# Patient Record
Sex: Female | Born: 1965
Health system: Southern US, Community
[De-identification: ages and names within clinical notes are randomized; demographics above are authoritative.]

## PROBLEM LIST (undated history)

## (undated) DIAGNOSIS — E785 Hyperlipidemia, unspecified: Secondary | ICD-10-CM

## (undated) DIAGNOSIS — F32A Depression, unspecified: Secondary | ICD-10-CM

## (undated) DIAGNOSIS — J45909 Unspecified asthma, uncomplicated: Secondary | ICD-10-CM

## (undated) DIAGNOSIS — E119 Type 2 diabetes mellitus without complications: Secondary | ICD-10-CM

## (undated) DIAGNOSIS — F329 Major depressive disorder, single episode, unspecified: Secondary | ICD-10-CM

## (undated) DIAGNOSIS — I10 Essential (primary) hypertension: Secondary | ICD-10-CM

## (undated) DIAGNOSIS — T7840XA Allergy, unspecified, initial encounter: Secondary | ICD-10-CM

## (undated) HISTORY — DX: Allergy, unspecified, initial encounter: T78.40XA

## (undated) HISTORY — DX: Type 2 diabetes mellitus without complications: E11.9

## (undated) HISTORY — DX: Essential (primary) hypertension: I10

## (undated) HISTORY — DX: Major depressive disorder, single episode, unspecified: F32.9

## (undated) HISTORY — DX: Unspecified asthma, uncomplicated: J45.909

## (undated) HISTORY — DX: Hyperlipidemia, unspecified: E78.5

## (undated) HISTORY — DX: Depression, unspecified: F32.A

---

## 2001-10-08 ENCOUNTER — Emergency Department (HOSPITAL_COMMUNITY): Admission: EM | Admit: 2001-10-08 | Discharge: 2001-10-08 | Payer: Self-pay | Admitting: *Deleted

## 2001-10-08 ENCOUNTER — Encounter: Payer: Self-pay | Admitting: *Deleted

## 2001-11-01 ENCOUNTER — Emergency Department (HOSPITAL_COMMUNITY): Admission: EM | Admit: 2001-11-01 | Discharge: 2001-11-01 | Payer: Self-pay | Admitting: *Deleted

## 2001-11-01 ENCOUNTER — Encounter: Payer: Self-pay | Admitting: *Deleted

## 2001-12-08 ENCOUNTER — Emergency Department (HOSPITAL_COMMUNITY): Admission: EM | Admit: 2001-12-08 | Discharge: 2001-12-08 | Payer: Self-pay | Admitting: *Deleted

## 2008-12-07 ENCOUNTER — Encounter: Admission: RE | Admit: 2008-12-07 | Discharge: 2008-12-07 | Payer: Self-pay | Admitting: Internal Medicine

## 2010-02-21 ENCOUNTER — Encounter: Payer: Self-pay | Admitting: Internal Medicine

## 2010-03-19 ENCOUNTER — Encounter: Payer: Self-pay | Admitting: Internal Medicine

## 2010-04-22 ENCOUNTER — Emergency Department (HOSPITAL_COMMUNITY)
Admission: EM | Admit: 2010-04-22 | Discharge: 2010-04-22 | Disposition: A | Discharge status: Home / Self Care | Payer: Medicare Other | Attending: Emergency Medicine | Admitting: Emergency Medicine

## 2010-04-22 DIAGNOSIS — M545 Low back pain, unspecified: Secondary | ICD-10-CM | POA: Insufficient documentation

## 2010-04-22 DIAGNOSIS — J45909 Unspecified asthma, uncomplicated: Secondary | ICD-10-CM | POA: Insufficient documentation

## 2010-04-22 DIAGNOSIS — E119 Type 2 diabetes mellitus without complications: Secondary | ICD-10-CM | POA: Insufficient documentation

## 2010-04-22 DIAGNOSIS — Z7982 Long term (current) use of aspirin: Secondary | ICD-10-CM | POA: Insufficient documentation

## 2010-04-22 DIAGNOSIS — Z794 Long term (current) use of insulin: Secondary | ICD-10-CM | POA: Insufficient documentation

## 2010-04-22 DIAGNOSIS — Z79899 Other long term (current) drug therapy: Secondary | ICD-10-CM | POA: Insufficient documentation

## 2010-04-22 DIAGNOSIS — I10 Essential (primary) hypertension: Secondary | ICD-10-CM | POA: Insufficient documentation

## 2013-01-22 ENCOUNTER — Other Ambulatory Visit: Payer: Self-pay

## 2013-01-22 DIAGNOSIS — Z1231 Encounter for screening mammogram for malignant neoplasm of breast: Secondary | ICD-10-CM

## 2013-12-10 ENCOUNTER — Ambulatory Visit: Payer: Self-pay

## 2014-10-26 ENCOUNTER — Encounter: Payer: Self-pay | Admitting: Gastroenterology

## 2014-11-03 ENCOUNTER — Other Ambulatory Visit: Payer: Self-pay | Admitting: Family

## 2014-11-03 DIAGNOSIS — Z1231 Encounter for screening mammogram for malignant neoplasm of breast: Secondary | ICD-10-CM

## 2014-11-16 ENCOUNTER — Ambulatory Visit: Payer: Self-pay

## 2014-12-13 ENCOUNTER — Encounter: Payer: Self-pay | Admitting: Family

## 2014-12-26 ENCOUNTER — Encounter: Payer: Self-pay | Admitting: Gastroenterology

## 2015-02-01 DIAGNOSIS — H52223 Regular astigmatism, bilateral: Secondary | ICD-10-CM | POA: Diagnosis not present

## 2015-02-01 DIAGNOSIS — H524 Presbyopia: Secondary | ICD-10-CM | POA: Diagnosis not present

## 2015-02-01 DIAGNOSIS — H5212 Myopia, left eye: Secondary | ICD-10-CM | POA: Diagnosis not present

## 2015-02-01 DIAGNOSIS — H40013 Open angle with borderline findings, low risk, bilateral: Secondary | ICD-10-CM | POA: Diagnosis not present

## 2015-02-02 ENCOUNTER — Encounter: Payer: Medicare Other | Attending: Internal Medicine

## 2015-02-02 VITALS — Ht 59.0 in | Wt 170.4 lb

## 2015-02-02 DIAGNOSIS — E119 Type 2 diabetes mellitus without complications: Secondary | ICD-10-CM | POA: Insufficient documentation

## 2015-02-02 DIAGNOSIS — Z713 Dietary counseling and surveillance: Secondary | ICD-10-CM | POA: Diagnosis not present

## 2015-02-02 NOTE — Progress Notes (Signed)
Patient was seen on 02/02/15 for the first of a series of three diabetes self-management courses at [facility].  Patient Education Plan per assessed needs and concerns is to attend four course education program for Diabetes Self Management Education.  The following learning objectives were met by the patient during this class:  Describe diabetes  State some common risk factors for diabetes  Defines the role of glucose and insulin  Identifies type of diabetes and pathophysiology  Describe the relationship between diabetes and cardiovascular risk  State the members of the Healthcare Team  States the rationale for glucose monitoring  State when to test glucose  State their individual Target Range  State the importance of logging glucose readings  Describe how to interpret glucose readings  Identifies A1C target  Explain the correlation between A1c and eAG values  State symptoms and treatment of high blood glucose  State symptoms and treatment of low blood glucose  Explain proper technique for glucose testing  Identifies proper sharps disposal  Handouts given during class include:  Living Well with Diabetes book  Carb Counting and Meal Planning book  Meal Plan Card  Carbohydrate guide  Meal planning worksheet  Low Sodium Flavoring Tips  The diabetes portion plate  S1N to eAG Conversion Chart  Diabetes Medications  Diabetes Recommended Care Schedule  Support Group  Diabetes Success Plan  Core Class Satisfaction Survey  Follow-Up Plan:  Attend core 2

## 2015-02-06 ENCOUNTER — Other Ambulatory Visit: Payer: Self-pay

## 2015-02-06 VITALS — BP 94/60 | Ht 59.0 in | Wt 171.1 lb

## 2015-02-06 DIAGNOSIS — Z794 Long term (current) use of insulin: Principal | ICD-10-CM

## 2015-02-06 DIAGNOSIS — E119 Type 2 diabetes mellitus without complications: Secondary | ICD-10-CM

## 2015-02-06 NOTE — Patient Outreach (Signed)
[facility]) Care Management  [facility]  02/06/2015   Delia HeadyJanice A Grandt 1965-06-23 [id number]  Subjective: Patient in for her [facility] [place] diabetes management program introductory visit.  She has started her diabetes classes and will complete 2 more over the next two weeks. She reports checking blood sugars 3 times per day; when she wakes up, 2 hours after lunch and at 9pm before she takes Lantus insulin.  She sets an alarm for 9pm to check her sugar.  She admits to skipping insulin often because her sugars are low (80-90mg /dl) and she is afraid for her blood sugar to go too low through the middle of the night.  When she has this type of blood sugar number, she eats sweets to prevent it from going any lower.  She eats three meals a day and likes sweet. Last A1C was 9%, up from 7.4% in September, 2016.    Objective:  Filed Vitals:   02/06/15 1506  BP: 94/60  Height: 1.499 m (4\' 11" )  Weight: 171 lb 1.6 oz (77.61 kg)     Current Medications:  Current Outpatient Prescriptions  Medication Sig Dispense Refill  . albuterol (PROVENTIL HFA;VENTOLIN HFA) 108 (90 Base) MCG/ACT inhaler Inhale 1 puff into the lungs every 6 (six) hours as needed.    Marland Kitchen. albuterol (PROVENTIL) (5 MG/ML) 0.5% nebulizer solution Take 2.5 mg by nebulization every 6 (six) hours as needed.    . fluticasone (FLONASE) 50 MCG/ACT nasal spray Place 1 spray into both nostrils daily.    . Fluticasone-Salmeterol (ADVAIR) 500-50 MCG/DOSE AEPB Inhale 1 puff into the lungs 2 (two) times daily.    Marland Kitchen. glucagon 1 MG injection Inject 1 mg into the muscle once as needed. Has never filled the prescription    . Insulin Glargine (LANTUS SOLOSTAR [place]) Inject 40 Units into the skin at bedtime.    Marland Kitchen. lisinopril-hydrochlorothiazide (PRINZIDE,ZESTORETIC) 20-12.5 MG tablet Take 1 tablet by mouth daily.    Marland Kitchen. loratadine (CLARITIN) 10 MG tablet Take 10 mg by mouth daily.    . montelukast (SINGULAIR) 10 MG tablet  Take 10 mg by mouth at bedtime.    . Multiple Vitamin (MULTIVITAMIN) capsule Take 1 capsule by mouth daily.    . simvastatin (ZOCOR) 20 MG tablet Take 20 mg by mouth daily.     No current facility-administered medications for this visit.    Functional Status:  No flowsheet data found.  Fall/Depression Screening: PHQ 2/9 Scores 02/06/2015 02/02/2015  PHQ - 2 Score 0 0    Assessment: Liborio NixonJanice is able to demonstrate the correct technique for taking her Lantus insulin.  She shows me her diabetes education material and appears to have a beginning understanding of meal planning. She wakes at 4am, eats at 9am, 1pm and 6pm with no snacks in between. She is skipping Lantus some nights for fear of going too low.   Plan:  I have asked Liborio NixonJanice to take her insulin EVERY night and to begin with 30 units so we build her confidence in NOT having lows.  We will continue to push her insulin up to 40 units of Lantus as ordered.  I have asked her to eat a snack when she wakes at 4 in the morning, eat a snack at 3pm because she's going 6 hours without food and complains of being hungry on her way home from work.  [facility] CM Care Plan Problem One        Most Recent Value  Care Plan Problem One  Elevated A1C   Role Documenting the Problem One  Care Management Coordinator   Care Plan for Problem One  Active   [facility] Long Term Goal (31-90 days)  Decrease the A1C to less than 9% by the next A1C check in 3 months   [facility] Long Term Goal Start Date  02/06/15   Interventions for Problem One Long Term Goal  1. Take Lantus every night - 30 units  2. Eat a snack every night- 1 carbohydrate and 1 protein at bed  3. limit sweets to your meal    I have given her a BP cuff and asked her to begin checking her BP and to bring it to her next MD visit.  I have reviewed the benefits of LiveLifeWell, taking her medication consistently, exercise and managing her diabetes.  I will follow up with her in a couple of weeks to make sure she's not  having hypo or hyper glycemia.  I reviewed the treatment of hypoglycemia and have given her glucose tablets.     [name], RN, BA, MHA, CDE [facility] Diabetes Coordinator- Link To [employer] Dial:  [phone]  Fax:  [phone] E-mail: [name] [address] [place], [place]  [address]

## 2015-02-09 DIAGNOSIS — E119 Type 2 diabetes mellitus without complications: Secondary | ICD-10-CM

## 2015-02-14 ENCOUNTER — Other Ambulatory Visit: Payer: Self-pay

## 2015-02-14 NOTE — Patient Outreach (Signed)
[facility]) Care Management  02/14/2015  Destiny Trujillo [birth year] [id number]  Spoke to [name] today by phone as part of the [facility] diabetes program of [place].  [name] reports being consistent with her Lantus 30 units qhs (she is ordered 40 units but was having a lot of low blood sugars and therefore was being inconsistent with taking the insulin)- she reports fasting blood sugars of 79-80mg /dl each am.  She reports now eating at 4am (she wasn't) when she wakes up (peanut butter crackers), breakfast at 9am, lunch at noon and supper at 6pm.   Her afternoon blood sugars are 123-140mg /dl.   She has a final diabetes class on 02/16/15.  I will follow up with her at the end of February, 2017.   [name], RN, BA, MHA, CDE [facility] Diabetes Coordinator- Link To [employer] Dial:  [phone]  Fax:  [phone] E-mail: [name] [address] [place], [place]  [address]

## 2015-02-16 DIAGNOSIS — E119 Type 2 diabetes mellitus without complications: Secondary | ICD-10-CM | POA: Diagnosis not present

## 2015-02-16 NOTE — Progress Notes (Signed)
Patient was seen on 02/16/15 for the third of a series of three diabetes self-management courses at [facility].   Janene Madeira the amount of activity recommended for healthy living . Describe activities suitable for individual needs . Identify ways to regularly incorporate activity into daily life . Identify barriers to activity and ways to over come these barriers  Identify diabetes medications being personally used and their primary action for lowering glucose and possible side effects . Describe role of stress on blood glucose and develop strategies to address psychosocial issues . Identify diabetes complications and ways to prevent them  Explain how to manage diabetes during illness . Evaluate success in meeting personal goal . Establish 2-3 goals that they will plan to diligently work on until they return for the  36-month follow-up visit  Goals:   I will take my diabetes medications as scheduled  I will eat less unhealthy fats by eating less sweets and junk foods  To help manage stress I will work out at least 5 times a week  Your patient has identified these potential barriers to change:  Finances  Your patient has identified their diabetes self-care support plan as  Family Education officer, environmental Resources Plan:  Attend Optional Core 4 in 4 months

## 2015-02-16 NOTE — Progress Notes (Signed)

## 2015-03-12 ENCOUNTER — Emergency Department (HOSPITAL_COMMUNITY)
Admission: EM | Admit: 2015-03-12 | Discharge: 2015-03-12 | Disposition: A | Discharge status: Home / Self Care | Payer: Medicare Other | Attending: Emergency Medicine | Admitting: Emergency Medicine

## 2015-03-12 ENCOUNTER — Encounter (HOSPITAL_COMMUNITY): Payer: Self-pay | Admitting: Emergency Medicine

## 2015-03-12 DIAGNOSIS — J45909 Unspecified asthma, uncomplicated: Secondary | ICD-10-CM | POA: Insufficient documentation

## 2015-03-12 DIAGNOSIS — Z880 Allergy status to penicillin: Secondary | ICD-10-CM | POA: Diagnosis not present

## 2015-03-12 DIAGNOSIS — Z79899 Other long term (current) drug therapy: Secondary | ICD-10-CM | POA: Diagnosis not present

## 2015-03-12 DIAGNOSIS — I10 Essential (primary) hypertension: Secondary | ICD-10-CM | POA: Diagnosis not present

## 2015-03-12 DIAGNOSIS — Z7984 Long term (current) use of oral hypoglycemic drugs: Secondary | ICD-10-CM | POA: Diagnosis not present

## 2015-03-12 DIAGNOSIS — J0100 Acute maxillary sinusitis, unspecified: Secondary | ICD-10-CM | POA: Diagnosis not present

## 2015-03-12 DIAGNOSIS — E119 Type 2 diabetes mellitus without complications: Secondary | ICD-10-CM | POA: Diagnosis not present

## 2015-03-12 DIAGNOSIS — R05 Cough: Secondary | ICD-10-CM | POA: Diagnosis present

## 2015-03-12 MED ORDER — AZITHROMYCIN 250 MG PO TABS: 250.0000 mg | ORAL_TABLET | Freq: Every day | ORAL | Status: DC

## 2015-03-12 NOTE — ED Notes (Signed)
Pt c/o shob, cough, congestion onset yesterday

## 2015-03-12 NOTE — Discharge Instructions (Signed)

## 2015-03-12 NOTE — ED Provider Notes (Signed)
CSN: [id number]     Arrival date & time 03/12/15  0247 History   First MD Initiated Contact with Patient 03/12/15 [phone]     Chief Complaint  Patient presents with  . Cough     (Consider location/radiation/quality/duration/timing/severity/associated sxs/prior Treatment) HPI Comments: Patient presents with cough, body aches, fever (Tmax 101 at home), sinus pressure and nasal congestion for the past 2 days. She reports multiple family members ill with similar symptoms. No nausea or vomiting. No headaches or sore throat. She has a history of astham and reports use of nebulizers with increased frequency during the last 2 days. No SOB currently.   Patient is a 50 y.o. female presenting with cough. The history is provided by the patient. No language interpreter was used.  Cough Associated symptoms: fever and myalgias   Associated symptoms: no chills, no headaches, no rash and no sore throat     Past Medical History  Diagnosis Date  . Asthma   . Hypertension   . Diabetes mellitus without complication (HCC)    History reviewed. No pertinent past surgical history. No family history on file. Social History  Substance Use Topics  . Smoking status: Never Smoker   . Smokeless tobacco: None  . Alcohol Use: No   OB History    No data available     Review of Systems  Constitutional: Positive for fever. Negative for chills.  HENT: Positive for congestion and sinus pressure. Negative for sore throat and trouble swallowing.   Respiratory: Positive for cough.   Cardiovascular: Negative.   Gastrointestinal: Negative.  Negative for nausea, vomiting and diarrhea.  Musculoskeletal: Positive for myalgias. Negative for neck stiffness.  Skin: Negative.  Negative for rash.  Neurological: Negative.  Negative for headaches.      Allergies  Penicillins  Home Medications   Prior to Admission medications   Medication Sig Start Date End Date Taking? Authorizing Provider   lisinopril-hydrochlorothiazide (PRINZIDE,ZESTORETIC) 20-12.5 MG tablet Take by mouth. 02/20/15  Yes Historical Provider, MD  ADVAIR DISKUS 500-50 MCG/DOSE AEPB Inhale 1 puff into the lungs 2 (two) times daily. 02/18/15   Historical Provider, MD  LANTUS SOLOSTAR 100 UNIT/ML Solostar Pen TAKE 40 UNITS DAILY. 02/18/15   Historical Provider, MD  metFORMIN (GLUCOPHAGE) 1000 MG tablet TAKE 1 TABLET (1,000 MG TOTAL) BY MOUTH 2 (TWO) TIMES A DAY WITH MEALS. 01/17/15   Historical Provider, MD  montelukast (SINGULAIR) 10 MG tablet Take 10 mg by mouth daily. 02/18/15   Historical Provider, MD  simvastatin (ZOCOR) 20 MG tablet Take 20 mg by mouth daily. 01/16/15   Historical Provider, MD   BP 117/84 mmHg  Pulse 94  Temp(Src) 99.1 F (37.3 C) (Oral)  Resp 18  Ht  (1.499 m)  Wt 73.483 kg  BMI 32.70 kg/m2  SpO2 97% Physical Exam  Constitutional: She is oriented to person, place, and time. She appears well-developed and well-nourished.  HENT:  Head: Normocephalic.  Nose: Mucosal edema present. Right sinus exhibits maxillary sinus tenderness. Left sinus exhibits maxillary sinus tenderness.  Mouth/Throat: Uvula is midline and oropharynx is clear and moist. Mucous membranes are not dry.  Neck: Normal range of motion. Neck supple.  Cardiovascular: Normal rate and regular rhythm.   Pulmonary/Chest: Effort normal and breath sounds normal.  Abdominal: Soft. Bowel sounds are normal. There is no tenderness. There is no rebound and no guarding.  Musculoskeletal: Normal range of motion.  Neurological: She is alert and oriented to person, place, and time.  Skin: Skin is warm  and dry. No rash noted.  Psychiatric: She has a normal mood and affect.    ED Course  Procedures (including critical care time) Labs Review Labs Reviewed - No data to display  Imaging Review No results found. I have personally reviewed and evaluated these images and lab results as part of my medical decision-making.   EKG  Interpretation None      MDM   Final diagnoses:  None    1. Sinusitis  The patient has symptoms of URI, body aches, cough. Here VSS. Flu-like illness vs sinusitis given sinus tenderness and nasal mucosal swelling. Will treat with abx and recommend PCP follow up this week for recheck.     Elpidio Anis, PA-C 03/12/15 0422  Cy Blamer, MD 03/12/15 [phone]

## 2015-03-16 ENCOUNTER — Encounter: Payer: Self-pay | Admitting: Internal Medicine

## 2015-03-16 ENCOUNTER — Ambulatory Visit (INDEPENDENT_AMBULATORY_CARE_PROVIDER_SITE_OTHER): Payer: 59 | Admitting: Internal Medicine

## 2015-03-16 ENCOUNTER — Other Ambulatory Visit (INDEPENDENT_AMBULATORY_CARE_PROVIDER_SITE_OTHER): Payer: 59

## 2015-03-16 VITALS — BP 108/78 | HR 97 | Temp 98.5°F | Resp 14 | Ht 59.0 in | Wt 170.8 lb

## 2015-03-16 DIAGNOSIS — E669 Obesity, unspecified: Secondary | ICD-10-CM

## 2015-03-16 DIAGNOSIS — IMO0001 Reserved for inherently not codable concepts without codable children: Secondary | ICD-10-CM

## 2015-03-16 DIAGNOSIS — E1165 Type 2 diabetes mellitus with hyperglycemia: Secondary | ICD-10-CM

## 2015-03-16 DIAGNOSIS — J4530 Mild persistent asthma, uncomplicated: Secondary | ICD-10-CM

## 2015-03-16 DIAGNOSIS — E785 Hyperlipidemia, unspecified: Secondary | ICD-10-CM | POA: Diagnosis not present

## 2015-03-16 DIAGNOSIS — Z794 Long term (current) use of insulin: Secondary | ICD-10-CM

## 2015-03-16 DIAGNOSIS — E119 Type 2 diabetes mellitus without complications: Secondary | ICD-10-CM

## 2015-03-16 DIAGNOSIS — I10 Essential (primary) hypertension: Secondary | ICD-10-CM | POA: Diagnosis not present

## 2015-03-16 LAB — COMPREHENSIVE METABOLIC PANEL
ALBUMIN: 4.6 g/dL (ref 3.5–5.2)
ALT: 19 U/L (ref 0–35)
AST: 16 U/L (ref 0–37)
Alkaline Phosphatase: 65 U/L (ref 39–117)
BILIRUBIN TOTAL: 0.2 mg/dL (ref 0.2–1.2)
BUN: 9 mg/dL (ref 6–23)
CALCIUM: 9.7 mg/dL (ref 8.4–10.5)
CHLORIDE: 99 meq/L (ref 96–112)
CO2: 32 mEq/L (ref 19–32)
CREATININE: 0.68 mg/dL (ref 0.40–1.20)
GFR: 117.51 mL/min (ref >60.00)
Glucose, Bld: 151 mg/dL — ABNORMAL HIGH (ref 70–99)
Potassium: 3.3 mEq/L — ABNORMAL LOW (ref 3.5–5.1)
Sodium: 138 mEq/L (ref 135–145)
Total Protein: 8.2 g/dL (ref 6.0–8.3)

## 2015-03-16 LAB — CBC
HCT: 39.3 % (ref 36.0–46.0)
Hemoglobin: 13.1 g/dL (ref 12.0–15.0)
MCHC: 33.2 g/dL (ref 30.0–36.0)
MCV: 81.8 fl (ref 78.0–100.0)
PLATELETS: 326 10*3/uL (ref 150.0–400.0)
RBC: 4.81 Mil/uL (ref 3.87–5.11)
RDW: 14 % (ref 11.5–15.5)
WBC: 6.8 10*3/uL (ref 4.0–10.5)

## 2015-03-16 LAB — MICROALBUMIN / CREATININE URINE RATIO
Creatinine,U: 62.3 mg/dL
Microalb Creat Ratio: 1.1 mg/g (ref 0.0–30.0)

## 2015-03-16 LAB — LIPID PANEL
CHOLESTEROL: 155 mg/dL (ref 0–200)
HDL: 49.3 mg/dL (ref >39.00)
LDL CALC: 72 mg/dL (ref 0–99)
NonHDL: 105.73
TRIGLYCERIDES: 170 mg/dL — AB (ref 0.0–149.0)
Total CHOL/HDL Ratio: 3
VLDL: 34 mg/dL (ref 0.0–40.0)

## 2015-03-16 LAB — HEMOGLOBIN A1C: Hgb A1c MFr Bld: 7.2 % — ABNORMAL HIGH (ref 4.6–6.5)

## 2015-03-16 NOTE — Patient Instructions (Addendum)
We will check the labs today and will likely make some changes.   We will plan to stop the lantus and start a once weekly injection called bydureon that will help with the sugars and with weight loss.   We will plan to stop your lisinopril/hctz and start lisinopril only. We will send this in after the blood work is back.   We have filled out the forms for the housing and you need to complete the rest of the form which has been returned to you today.   Diabetes and Exercise Exercising regularly is important. It is not just about losing weight. It has many health benefits, such as:  Improving your overall fitness, flexibility, and endurance.  Increasing your bone density.  Helping with weight control.  Decreasing your body fat.  Increasing your muscle strength.  Reducing stress and tension.  Improving your overall health. People with diabetes who exercise gain additional benefits because exercise:  Reduces appetite.  Improves the body's use of blood sugar (glucose).  Helps lower or control blood glucose.  Decreases blood pressure.  Helps control blood lipids (such as cholesterol and triglycerides).  Improves the body's use of the hormone insulin by:  Increasing the body's insulin sensitivity.  Reducing the body's insulin needs.  Decreases the risk for heart disease because exercising:  Lowers cholesterol and triglycerides levels.  Increases the levels of good cholesterol (such as high-density lipoproteins [HDL]) in the body.  Lowers blood glucose levels. YOUR ACTIVITY PLAN  Choose an activity that you enjoy, and set realistic goals. To exercise safely, you should begin practicing any new physical activity slowly, and gradually increase the intensity of the exercise over time. Your health care provider or diabetes educator can help create an activity plan that works for you. General recommendations include:  Encouraging children to engage in at least 60 minutes of  physical activity each day.  Stretching and performing strength training exercises, such as yoga or weight lifting, at least 2 times per week.  Performing a total of at least 150 minutes of moderate-intensity exercise each week, such as brisk walking or water aerobics.  Exercising at least 3 days per week, making sure you allow no more than 2 consecutive days to pass without exercising.  Avoiding long periods of inactivity (90 minutes or more). When you have to spend an extended period of time sitting down, take frequent breaks to walk or stretch. RECOMMENDATIONS FOR EXERCISING WITH TYPE 1 OR TYPE 2 DIABETES   Check your blood glucose before exercising. If blood glucose levels are greater than 240 mg/dL, check for urine ketones. Do not exercise if ketones are present.  Avoid injecting insulin into areas of the body that are going to be exercised. For example, avoid injecting insulin into:  The arms when playing tennis.  The legs when jogging.  Keep a record of:  Food intake before and after you exercise.  Expected peak times of insulin action.  Blood glucose levels before and after you exercise.  The type and amount of exercise you have done.  Review your records with your health care provider. Your health care provider will help you to develop guidelines for adjusting food intake and insulin amounts before and after exercising.  If you take insulin or oral hypoglycemic agents, watch for signs and symptoms of hypoglycemia. They include:  Dizziness.  Shaking.  Sweating.  Chills.  Confusion.  Drink plenty of water while you exercise to prevent dehydration or heat stroke. Body water is lost  during exercise and must be replaced.  Talk to your health care provider before starting an exercise program to make sure it is safe for you. Remember, almost any type of activity is better than none.   This information is not intended to replace advice given to you by your health care  provider. Make sure you discuss any questions you have with your health care provider.   Document Released: 03/30/2003 Document Revised: 05/24/2014 Document Reviewed: 06/16/2012 Elsevier Interactive Patient Education [employer]

## 2015-03-16 NOTE — Progress Notes (Signed)
Pre visit review using our clinic review tool, if applicable. No additional management support is needed unless otherwise documented below in the visit note. 

## 2015-03-18 DIAGNOSIS — E118 Type 2 diabetes mellitus with unspecified complications: Secondary | ICD-10-CM | POA: Insufficient documentation

## 2015-03-18 DIAGNOSIS — J45909 Unspecified asthma, uncomplicated: Secondary | ICD-10-CM | POA: Insufficient documentation

## 2015-03-18 DIAGNOSIS — E1169 Type 2 diabetes mellitus with other specified complication: Secondary | ICD-10-CM | POA: Insufficient documentation

## 2015-03-18 DIAGNOSIS — I10 Essential (primary) hypertension: Secondary | ICD-10-CM | POA: Insufficient documentation

## 2015-03-18 DIAGNOSIS — E785 Hyperlipidemia, unspecified: Secondary | ICD-10-CM

## 2015-03-18 NOTE — Assessment & Plan Note (Signed)
BP at goal on lisinopril/hctz. Checking CMP and adjust as indicated.

## 2015-03-18 NOTE — Assessment & Plan Note (Signed)
Checking lipid panel, no side effects from zocor. Adjust as indicated.

## 2015-03-18 NOTE — Progress Notes (Signed)
   Subjective:    Patient ID: Destiny Trujillo, female    DOB: Jun 11, 1965, 50 y.o.   MRN: [id number]  HPI The patient is a new 50 YO female coming in for continuation of her medical problems including her diabetes (diagnosed in [place] and HgA1c over 10 so started on the lantus initially then added metformin, she is not sure of control, taking lantus, metformin with some low sugars in the morning, unsure if complicated, eye exam recent and normal), her blood pressure (controlled on lisinopril/hctz, no side effects), and her asthma (not well controlled and winded by activity, lives on upper floor apartment which is difficult for her, uses advair and if she misses that she can really tell the difference, no flare recently and never intubated). No acute concerns today.  PMH, [facility], social history reviewed and updated.   Review of Systems  Constitutional: Negative for fever, activity change, appetite change, fatigue and unexpected weight change.  HENT: Negative.   Eyes: Negative.   Respiratory: Negative.   Cardiovascular: Negative for chest pain, palpitations and leg swelling.  Gastrointestinal: Negative for nausea, abdominal pain, diarrhea, constipation, blood in stool and abdominal distention.  Musculoskeletal: Negative.   Skin: Negative.   Neurological: Negative.  Negative for weakness and numbness.  Psychiatric/Behavioral: Negative.       Objective:   Physical Exam  Constitutional: She is oriented to person, place, and time. She appears well-developed and well-nourished.  Overweight  HENT:  Head: Normocephalic and atraumatic.  Eyes: EOM are normal.  Neck: Normal range of motion.  Cardiovascular: Normal rate and regular rhythm.   Pulmonary/Chest: Effort normal and breath sounds normal. No respiratory distress. She has no wheezes. She has no rales.  Abdominal: Soft. Bowel sounds are normal. She exhibits no distension. There is no tenderness. There is no rebound.  Musculoskeletal: She exhibits  no edema.  Neurological: She is alert and oriented to person, place, and time. Coordination normal.  Skin: Skin is warm and dry.  Psychiatric: She has a normal mood and affect.   Filed Vitals:   03/16/15 1532  BP: 108/78  Pulse: 97  Temp: 98.5 F (36.9 C)  TempSrc: Oral  Resp: 14  Height:  (1.499 m)  Weight: 170 lb 12.8 oz (77.474 kg)  SpO2: 99%      Assessment & Plan:

## 2015-03-18 NOTE — Assessment & Plan Note (Signed)
Still having daily symptoms with stairs. Signed papers today to request ground level apartment for her. Using advair daily and albuterol prn. Not in exacerbation today. Suspect that some deconditioning is in play as well.

## 2015-03-18 NOTE — Assessment & Plan Note (Signed)
Reminded her about the need for regular exercise for weight loss and for her health. She is working on finding time to do that and tries to park far away at work.

## 2015-03-18 NOTE — Assessment & Plan Note (Signed)
Depending on control may try to stop the lantus and add weigh loss promoting agent such as glp-1. Metformin is maxed out without side effects. On ACE-I and statin. No known complications although getting records to verify. Foot exam today normal. Some low sugars in the morning so will need dose adjustment of lantus if we are not able to stop.

## 2015-03-20 ENCOUNTER — Other Ambulatory Visit: Payer: Self-pay

## 2015-03-20 DIAGNOSIS — E119 Type 2 diabetes mellitus without complications: Secondary | ICD-10-CM

## 2015-03-20 DIAGNOSIS — Z794 Long term (current) use of insulin: Principal | ICD-10-CM

## 2015-03-20 NOTE — Patient Outreach (Signed)
[facility]) Care Management  03/20/2015  Destiny Trujillo Dec 12, 1965 [id number]   Spoke to [name] by phone- she did not get words from her MD UX:LKGMWN A1C but was pleased with A1C of 7.2%. She has been consistently using Lantus 30 units qhs - using an alarm on her phone to remind her.  She wakes at 4am, and is now eating peanut butter crackers at 5am- she no longer has low blood sugars and has begun to exercise 3x/week for 30 minutes at the [facility] directly after work.  If she's hungry, she eats peanut butter crackers before she exercises. She reports fasting blood sugars 80-90 mg/dl with no low blood sugar symptoms.  She is keeping candy and glucose tabs with her at all times but has not had to use them.    She has switched to [facility] on [address]    [name], RN, BA, MHA, CDE [facility] Diabetes Coordinator- Link To [employer] Dial:  [phone]  Fax:  [phone] E-mail: [name] [address], [place], [place]  [address]

## 2015-03-21 ENCOUNTER — Other Ambulatory Visit: Payer: Self-pay | Admitting: Internal Medicine

## 2015-03-21 MED ORDER — EXENATIDE ER 2 MG ~~LOC~~ PEN: 2.0000 mg | PEN_INJECTOR | SUBCUTANEOUS | Status: DC

## 2015-04-17 ENCOUNTER — Other Ambulatory Visit: Payer: Self-pay

## 2015-04-17 NOTE — Patient Outreach (Signed)
[facility] ([facility]) Care Management  04/17/2015  Destiny HeadyJanice A Trujillo 08-Mar-1965 [id number]   Called patient to [facility], BP, asthma and if she's having any hypoglycemia.  I have asked her to return my call.   Susette RacerJulie Brandalynn Ofallon, RN, BA, MHA, CDE [facility] Diabetes Coordinator- [employer] To [employer] Dial:  [phone]  Fax:  [phone] E-mail: [email or web address] [address], [place], [place]  [id number]

## 2015-04-25 ENCOUNTER — Other Ambulatory Visit: Payer: Self-pay

## 2015-04-25 NOTE — Patient Outreach (Signed)
[facility]) Care Management  04/25/2015  Destiny HeadyJanice A Trujillo 19-Apr-1965 [id number]  Destiny Trujillo called to report that she stopped taking the Bydureon her doctor ordered for her- she tells me she tried it for two weeks in a row but it made her feel "bad", a headache the whole time.  She has not shared this with her physician.  I have asked her to reach out to the doctor so she can get an alternative medication- no other medication in this class is free on the [facility] plan. Destiny Trujillo.  Destiny Trujillo also enquired about paying for her medication, complaining that they were still costing her money.  I discovered that Destiny Trujillo has still been picking her medications up from [facility]- I have [facility].   Destiny RacerJulie Lyndle Pang, RN, BA, MHA, CDE [facility] Diabetes Coordinator- Link To [employer] Dial:  [phone]  Fax:  [phone] E-mail: [email or web address] [address], [place], [place]  [id number]

## 2015-05-01 ENCOUNTER — Other Ambulatory Visit: Payer: Self-pay

## 2015-05-01 NOTE — Patient Outreach (Signed)
[facility]) Care Management  05/01/2015  Delia HeadyJanice A Sokoloski 08/31/1965 [id number]  [name] Kanesha to schedule an appointment- left a message at her home with a female. Works 5:30 to 2pm therefore I will need to schedule after 2pm.   Susette RacerJulie Dakin Madani, RN, BA, MHA, CDE [facility] Diabetes Coordinator- Link To Wellness Direct Dial:  [phone]  Fax:  [phone] E-mail: [email or web address] [address], [place], [place]  [address]

## 2015-05-08 ENCOUNTER — Other Ambulatory Visit: Payer: Self-pay

## 2015-05-08 NOTE — Patient Outreach (Signed)
[facility]N) Care Management  05/08/2015  Delia HeadyJanice A Lierman [birth year] [id number]   Called patient to schedule a visit.  Left message with a female at the home since the patient was unavailable.    Susette RacerJulie Wai Litt, RN, BA, MHA, CDE [facility] Diabetes Coordinator- Link To [employer] Direct Dial:  [phone]  Fax:  [phone] E-mail: [email or web address] [address], [place], [place]  [address]

## 2015-05-08 NOTE — Patient Outreach (Signed)
[facility]) Care Management  05/08/2015  Destiny HeadyJanice A Trujillo 1965-01-29 [id number]  [name] [name] to schedule an appointment- she was not home but I have left a message with a female at the home to have InksterJanice call me to reschedule a visit.    Susette RacerJulie Jafar Poffenberger, RN, BA, MHA, CDE [facility] Diabetes Coordinator- Link To [employer] Direct Dial:  [phone]  Fax:  [phone] E-mail: [name] [address], [place], [place]  [address]

## 2015-05-17 NOTE — Patient Outreach (Signed)
[facility]N) Care Management  05/17/2015  Delia HeadyJanice A Dority April 11, 1965 [id number]   I have sent a letter to the patient to request she contact me to schedule an appointment.   Susette RacerJulie Barbra Miner, RN, BA, MHA, CDE [facility] Diabetes Coordinator- Link To [employer] Direct Dial:  [phone]  Fax:  [phone] E-mail: [email or web address] [address], [place], [place]  [address]

## 2015-05-29 ENCOUNTER — Other Ambulatory Visit: Payer: Self-pay

## 2015-05-29 NOTE — Patient Outreach (Signed)
[facility]) Care Management  05/29/2015  Destiny HeadyJanice A Trujillo May 09, 1965 [id number]   Liborio NixonJanice left me a message requesting I retrun her call.   Susette RacerJulie Darik Massing, RN, BA, MHA, CDE [facility] Diabetes Coordinator- Link To [employer] Direct Dial:  [phone]  Fax:  [phone] E-mail: [email or web address] [address], [place], [place]  [address]

## 2015-05-29 NOTE — Patient Outreach (Signed)
[facility]) Care Management  05/29/2015  Delia HeadyJanice A Stegner Nov 11, 1965 [id number]  I have left Liborio NixonJanice a message to schedule an appointment with me.   Susette RacerJulie Julian Medina, RN, BA, MHA, CDE [facility] Diabetes Coordinator- Link To [employer] Direct Dial:  [phone]  Fax:  [phone] E-mail: [email or web address] [address], [place], [place]  [address]

## 2015-06-01 ENCOUNTER — Other Ambulatory Visit: Payer: Self-pay | Admitting: *Deleted

## 2015-06-01 ENCOUNTER — Telehealth: Payer: Self-pay | Admitting: *Deleted

## 2015-06-01 MED ORDER — METFORMIN HCL 1000 MG PO TABS: 1000.0000 mg | ORAL_TABLET | Freq: Two times a day (BID) | ORAL | Status: DC

## 2015-06-01 MED ORDER — FLUTICASONE-SALMETEROL 500-50 MCG/DOSE IN AEPB: 1.0000 | INHALATION_SPRAY | Freq: Two times a day (BID) | RESPIRATORY_TRACT | Status: DC

## 2015-06-01 MED ORDER — FLUTICASONE PROPIONATE 50 MCG/ACT NA SUSP: 1.0000 | Freq: Every day | NASAL | Status: DC

## 2015-06-01 MED ORDER — LORATADINE 10 MG PO TABS: 10.0000 mg | ORAL_TABLET | Freq: Every day | ORAL | Status: DC

## 2015-06-01 MED ORDER — ALBUTEROL SULFATE (5 MG/ML) 0.5% IN NEBU: 2.5000 mg | INHALATION_SOLUTION | Freq: Four times a day (QID) | RESPIRATORY_TRACT | Status: DC | PRN

## 2015-06-01 MED ORDER — LISINOPRIL-HYDROCHLOROTHIAZIDE 20-12.5 MG PO TABS: 1.0000 | ORAL_TABLET | Freq: Every day | ORAL | Status: DC

## 2015-06-01 MED ORDER — IPRATROPIUM-ALBUTEROL 0.5-2.5 (3) MG/3ML IN SOLN: 3.0000 mL | Freq: Four times a day (QID) | RESPIRATORY_TRACT | Status: DC | PRN

## 2015-06-01 MED ORDER — SIMVASTATIN 20 MG PO TABS: 20.0000 mg | ORAL_TABLET | Freq: Every day | ORAL | Status: DC

## 2015-06-01 MED ORDER — MONTELUKAST SODIUM 10 MG PO TABS: 10.0000 mg | ORAL_TABLET | Freq: Every day | ORAL | Status: DC

## 2015-06-01 MED ORDER — ALBUTEROL SULFATE HFA 108 (90 BASE) MCG/ACT IN AERS: 1.0000 | INHALATION_SPRAY | Freq: Four times a day (QID) | RESPIRATORY_TRACT | Status: DC | PRN

## 2015-06-01 NOTE — Telephone Encounter (Signed)
Left msg on triage stating received script for the albuterol sol 2.5 wanting to know will it be ok to change to the albuterol vial 2.5 mg/613ml, because we do not carry plain 2.5.  Sent updated script...Raechel Chute/lmb

## 2015-06-01 NOTE — Telephone Encounter (Signed)
Received call [facility] sent request to have refills on her medications. Verified which med pt is needing because per chart no request has been sent electronically. Inform pt will send refills on all meds...Raechel Chute/lmb

## 2015-06-12 ENCOUNTER — Ambulatory Visit: Payer: Self-pay | Admitting: Physician Assistant

## 2015-06-22 ENCOUNTER — Ambulatory Visit: Payer: 59 | Admitting: Internal Medicine

## 2015-06-22 DIAGNOSIS — Z0289 Encounter for other administrative examinations: Secondary | ICD-10-CM

## 2015-06-26 ENCOUNTER — Other Ambulatory Visit: Payer: Self-pay

## 2015-06-26 NOTE — Patient Outreach (Signed)
[facility]) Care Management  [facility] Care Manager  06/26/2015   Destiny Trujillo 02/09/1965 [id number]  Subjective: Patient was in for her Link to Wellness diabetes appointment- it has taken numerous phone calls and a letter to get her back in.  She reports that she has also missed a recent MD follow up visit.  She does not have her meter with her but reports fasting CBG 90-179mg /dl and 2 hour after supper 123-220mg /dl.  Later in our conversation she tells me that her am CBG are after eating breakfast (she eats cheese and crackers at 4am and eats breakfast at work at [employer].  She tells me she has moved out of her home where she was living with her daughter and 7 grand-children.  Her eldest granddaughter lives with her often but Destiny Trujillo is feeling much less stress and has less food distractions now that she is living alone. She tells me she did not tolerate the Bydureon and has returned to Lantus 30 units qpm. She continues to take Metformin  bid- her asthma has been well controlled. She denies hypoglycemia.   Objective:  Filed Vitals:   06/26/15 1455  BP: 110/70  Pulse: 84  Height: 1.499 m ( )  Weight: 166 lb (75.297 kg)  SpO2: 97%   Weight is down from 171.1lbs in January 2017  Encounter Medications:  Outpatient Encounter Prescriptions as of 06/26/2015  Medication Sig  . aspirin 81 MG tablet Take 81 mg by mouth daily.  . fluticasone (FLONASE) 50 MCG/ACT nasal spray Place 1 spray into both nostrils daily. (Patient taking differently: Place 2 sprays into both nostrils daily. )  . Fluticasone-Salmeterol (ADVAIR) 500-50 MCG/DOSE AEPB Inhale 1 puff into the lungs 2 (two) times daily.  . Insulin Glargine (LANTUS) 100 UNIT/ML Solostar Pen Inject 30 Units into the skin daily at 8 pm.  . lisinopril-hydrochlorothiazide (PRINZIDE,ZESTORETIC) 20-12.5 MG tablet Take 1 tablet by mouth daily.  Marland Kitchen loratadine (CLARITIN) 10 MG tablet Take 1 tablet (10 mg total) by mouth daily.  .  metFORMIN (GLUCOPHAGE) 1000 MG tablet Take 1 tablet (1,000 mg total) by mouth 2 (two) times daily with a meal.  . montelukast (SINGULAIR) 10 MG tablet Take 1 tablet (10 mg total) by mouth at bedtime.  . Multiple Vitamin (MULTIVITAMIN) capsule Take 1 capsule by mouth daily.  . simvastatin (ZOCOR) 20 MG tablet Take 1 tablet (20 mg total) by mouth daily.  Marland Kitchen albuterol (PROVENTIL HFA;VENTOLIN HFA) 108 (90 Base) MCG/ACT inhaler Inhale 1 puff into the lungs every 6 (six) hours as needed for wheezing or shortness of breath. (Patient taking differently: Inhale 1 puff into the lungs every 6 (six) hours as needed for wheezing or shortness of breath. Takes 2 puffs 2x/day if needed)  . Exenatide ER (BYDUREON) 2 MG PEN Inject 2 mg into the skin once a week. (Patient not taking: Reported on 06/26/2015)  . ipratropium-albuterol (DUONEB) 0.5-2.5 (3) MG/3ML SOLN Take 3 mLs by nebulization every 6 (six) hours as needed. (Patient taking differently: Take 3 mLs by nebulization every 6 (six) hours as needed. At hs if needed)   No facility-administered encounter medications on file as of 06/26/2015.    Functional Status:  In your present state of health, do you have any difficulty performing the following activities: 06/26/2015  Hearing? N  Vision? N  Difficulty concentrating or making decisions? N  Walking or climbing stairs? N  Dressing or bathing? N  Doing errands, shopping? N    Fall/Depression Screening: PHQ 2/9 Scores  06/26/2015 02/06/2015 02/02/2015  PHQ - 2 Score 0 0 0    Assessment: Based on Rennie's verbal responses, she appears to be effectively managing her diabetes.  She has no complaints.  She will need to have an A1C to determine control since we have no blood sugar report / meter to assess control.   Plan: Continue to take Lantus 30 units qpm and Metformin 1000mg  bid- she misses her insulin about 2x/week ("because blood sugars are 103mg /dl in the evening so I don't take it") and doesn't miss taking her  Metformin. Continue to eat hs snack every evening with 1 carb and 1 protein.  Limit seets (1) to meals  [facility] Problem One        Most Recent Value   Care Plan for Problem One  Not Active    [facility] CM Care Plan Problem Two        Most Recent Value   Care Plan for Problem Two  Not Active   [facility] Long Term Goal Start Date  03/20/15    [facility] Care Plan Problem Three        Most Recent Value   Care Plan Problem Three  Missed follow up MD appointment   Role Documenting the Problem Three  Care Management Coordinator   Care Plan for Problem Three  Active   [facility] Long Term Goal (31-90) days  Patient will follow up with MD by the end of July 2017    [facility] Start Date  06/26/15   Interventions for Problem Three Long Term Goal  Ask MD to reassess A1C  Follow up with me on 10/02/15     Follow up with me on 10/02/15   Susette RacerJulie Madalene Mickler, RN, BA, MHA, CDE [facility] Diabetes Coordinator- Link To Wellness Direct Dial:  [phone]  Fax:  [phone] E-mail: [email or web address] [address], [place], [place]  [id number]

## 2015-07-07 ENCOUNTER — Ambulatory Visit: Payer: Self-pay | Admitting: Physician Assistant

## 2015-07-07 ENCOUNTER — Encounter: Payer: Self-pay | Admitting: Physician Assistant

## 2015-07-07 VITALS — BP 114/68 | HR 72 | Temp 98.7°F

## 2015-07-07 DIAGNOSIS — J069 Acute upper respiratory infection, unspecified: Secondary | ICD-10-CM

## 2015-07-07 MED ORDER — AZITHROMYCIN 250 MG PO TABS: ORAL_TABLET | ORAL | Status: DC

## 2015-07-07 NOTE — Progress Notes (Signed)
S: C/o runny nose and congestion for 3 days, prod cough with yellow mucus,  Fever last night into this morning, no chills, cp/sob, v/d; mucus was green this am but clear throughout the day, cough is sporadic, hx of asthma Using otc meds: robitussin  O: PE: vitals wnl, nad, perrl eomi, normocephalic, tms dull, nasal mucosa red and swollen, throat injected, neck supple no lymph, lungs c t a, cv rrr, neuro intact  A:  Acute uri   P: zpack, drink fluids, continue regular meds , use otc meds of choice, return if not improving in 5 days, return earlier if worsening

## 2015-10-02 ENCOUNTER — Ambulatory Visit: Payer: Self-pay

## 2015-10-31 NOTE — Patient Outreach (Signed)
[facility]) Care Management  10/31/2015  Destiny Trujillo February 22, 1965 [id number]   Discharge note sent to the MD.  Case closed.   [name], RN, BA, MHA, CDE [facility] Diabetes Coordinator- Link To [employer] Dial:  [phone]  Fax:  [phone] E-mail: [name] [address] [place], [place]  [address]

## 2015-10-31 NOTE — Patient Outreach (Signed)
[facility]) Care Management  10/31/2015  Destiny Trujillo 1965-04-08 [id number]   Discharge note sent to patient and her provider.    [name], RN, BA, MHA, CDE [facility] Diabetes Coordinator- Link To [employer] Dial:  [phone]  Fax:  [phone] E-mail: [name] [address], [place], [place]  [address]

## 2015-11-30 ENCOUNTER — Ambulatory Visit: Payer: Medicare Other | Attending: Family Medicine | Admitting: Physical Therapy

## 2015-12-05 ENCOUNTER — Telehealth: Payer: Self-pay | Admitting: Physical Therapy

## 2015-12-05 NOTE — Telephone Encounter (Signed)
11/30/15 no show for PT eval attempted to call on 12/05/15 to reschedule PT eval, phone disconnected

## 2015-12-25 ENCOUNTER — Ambulatory Visit: Payer: Medicare Other | Attending: Family Medicine | Admitting: Physical Therapy

## 2016-01-04 ENCOUNTER — Ambulatory Visit: Payer: Medicare Other | Admitting: Physical Therapy

## 2016-01-16 ENCOUNTER — Encounter: Payer: Self-pay | Admitting: Medical Oncology

## 2016-01-16 ENCOUNTER — Emergency Department
Admission: EM | Admit: 2016-01-16 | Discharge: 2016-01-16 | Disposition: A | Discharge status: Home / Self Care | Payer: Medicare Other | Attending: Emergency Medicine | Admitting: Emergency Medicine

## 2016-01-16 DIAGNOSIS — M5442 Lumbago with sciatica, left side: Secondary | ICD-10-CM | POA: Insufficient documentation

## 2016-01-16 DIAGNOSIS — Z794 Long term (current) use of insulin: Secondary | ICD-10-CM | POA: Diagnosis not present

## 2016-01-16 DIAGNOSIS — Z7982 Long term (current) use of aspirin: Secondary | ICD-10-CM | POA: Diagnosis not present

## 2016-01-16 DIAGNOSIS — I10 Essential (primary) hypertension: Secondary | ICD-10-CM | POA: Diagnosis not present

## 2016-01-16 DIAGNOSIS — Z79899 Other long term (current) drug therapy: Secondary | ICD-10-CM | POA: Insufficient documentation

## 2016-01-16 DIAGNOSIS — E119 Type 2 diabetes mellitus without complications: Secondary | ICD-10-CM | POA: Diagnosis not present

## 2016-01-16 DIAGNOSIS — M5432 Sciatica, left side: Secondary | ICD-10-CM

## 2016-01-16 DIAGNOSIS — J45909 Unspecified asthma, uncomplicated: Secondary | ICD-10-CM | POA: Diagnosis not present

## 2016-01-16 DIAGNOSIS — M545 Low back pain: Secondary | ICD-10-CM | POA: Diagnosis present

## 2016-01-16 MED ORDER — DICLOFENAC SODIUM 75 MG PO TBEC: 75.0000 mg | DELAYED_RELEASE_TABLET | Freq: Two times a day (BID) | ORAL | 1 refills | Status: DC

## 2016-01-16 MED ORDER — CYCLOBENZAPRINE HCL 5 MG PO TABS: 5.0000 mg | ORAL_TABLET | Freq: Three times a day (TID) | ORAL | 0 refills | Status: DC | PRN

## 2016-01-16 NOTE — ED Provider Notes (Signed)
[facility] Emergency Department Provider Note ____________________________________________  Time seen: 1007  I have reviewed the triage vital signs and the nursing notes.  HISTORY  Chief Complaint  Leg Pain and Back Pain  HPI Destiny Trujillo is a 50 y.o. female presents to the ED for evaluation of left-sided sciatic irritation, with intermittent symptoms over the last 3 months. She describes the onset of symptoms back in September while at work. She was evaluated by her primary care provider at the time, and placed on Flexeril and ibuprofen. She reports her symptoms resolved within 2 weeks of dosing medications. She reports now however a flare of her symptoms with return of her left leg pain and weakness. She denies any interim injury, accident, fall, trauma. She denies any bladder or bowel incontinence, foot drop, or disability. She has not followed up with her primary care provider after she discontinued physical therapy after one visit back in September. 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 She also notes that her provider has her on intermittent FMLA related to this complaint.  Past Medical History:  Diagnosis Date  . Allergy    seasonal  . Asthma   . Depression   . Diabetes mellitus without complication (HCC)   . Hyperlipidemia   . Hypertension     Patient Active Problem List   Diagnosis Date Noted  . Type 2 diabetes mellitus without complication (HCC) 03/18/2015  . Essential hypertension 03/18/2015  . Hyperlipidemia 03/18/2015  . Asthma 03/18/2015  . Obesity 03/18/2015    History reviewed. No pertinent surgical history.  Prior to Admission medications   Medication Sig Start Date End Date Taking? Authorizing Provider  ADVAIR DISKUS 500-50 MCG/DOSE AEPB Inhale 1 puff into the lungs 2 (two) times daily. 02/18/15   Historical Provider, MD  albuterol (PROVENTIL HFA;VENTOLIN HFA) 108 (90 Base) MCG/ACT inhaler Inhale 1  puff into the lungs every 6 (six) hours as needed for wheezing or shortness of breath. Patient taking differently: Inhale 1 puff into the lungs every 6 (six) hours as needed for wheezing or shortness of breath. Takes 2 puffs 2x/day if needed 06/01/15   Myrlene Broker, MD  aspirin 81 MG tablet Take 81 mg by mouth daily.    Historical Provider, MD  azithromycin (ZITHROMAX Z-PAK) 250 MG tablet 2 pills today then 1 pill a day for 4 days 07/07/15   Faythe Ghee, PA-C  cyclobenzaprine (FLEXERIL) 5 MG tablet Take 1 tablet (5 mg total) by mouth 3 (three) times daily as needed for muscle spasms. 01/16/16   Courtnee Myer V Bacon Thelma Viana, PA-C  diclofenac (VOLTAREN) 75 MG EC tablet Take 1 tablet (75 mg total) by mouth 2 (two) times daily. 01/16/16   Shaman Muscarella V Bacon Kiowa Hollar, PA-C  diphenhydramine-acetaminophen (TYLENOL PM) 25-500 MG TABS tablet Take 1 tablet by mouth at bedtime as needed (for pain/sleep). Reported on 07/07/2015    Historical Provider, MD  Exenatide ER (BYDUREON) 2 MG PEN Inject 2 mg into the skin once a week. Patient not taking: Reported on 06/26/2015 03/21/15   Myrlene Broker, MD  fluticasone [facility]) 50 MCG/ACT nasal spray Place 1 spray into both nostrils daily. Patient taking differently: Place 2 sprays into both nostrils daily.  06/01/15   Myrlene Broker, MD  Fluticasone-Salmeterol (ADVAIR) 500-50 MCG/DOSE AEPB Inhale 1 puff into the lungs 2 (two) times daily. Patient not taking: Reported on 07/07/2015 06/01/15   Myrlene Broker, MD  Insulin Glargine (LANTUS) 100  UNIT/ML Solostar Pen Inject 30 Units into the skin daily at 8 pm.    Historical Provider, MD  ipratropium-albuterol (DUONEB) 0.5-2.5 (3) MG/3ML SOLN Take 3 mLs by nebulization every 6 (six) hours as needed. Patient not taking: Reported on 07/07/2015 06/01/15   Myrlene BrokerElizabeth A Crawford, MD  lisinopril-hydrochlorothiazide (PRINZIDE,ZESTORETIC) 20-12.5 MG tablet Take 1 tablet by mouth daily.  02/20/15   Historical Provider, MD   loratadine (CLARITIN) 10 MG tablet Take 1 tablet (10 mg total) by mouth daily. 06/01/15   Myrlene BrokerElizabeth A Crawford, MD  metFORMIN (GLUCOPHAGE) 1000 MG tablet TAKE 1 TABLET (1,000 MG TOTAL) BY MOUTH 2 (TWO) TIMES A DAY WITH MEALS. 01/17/15   Historical Provider, MD  Multiple Vitamin (MULTIVITAMIN) capsule Take 1 capsule by mouth daily.    Historical Provider, MD    Allergies Penicillins and Penicillins  Family History  Problem Relation Age of Onset  . Hypertension Mother   . Diabetes Mother   . Dementia Mother   . Hypertension Father   . Diabetes Father   . Diabetes Sister   . Angina Sister   . Cancer Brother   . Cancer Maternal Aunt     Social History Social History  Substance Use Topics  . Smoking status: Never Smoker  . Smokeless tobacco: Never Used  . Alcohol use No    Review of Systems  Constitutional: Negative for fever. Respiratory: Negative for shortness of breath. Gastrointestinal: Negative for abdominal pain, vomiting and diarrhea. Genitourinary: Negative for dysuria. Musculoskeletal: Positive for back pain with left LE referral.. Skin: Negative for rash. Neurological: Negative for headaches, focal weakness or numbness. ____________________________________________  PHYSICAL EXAM:  VITAL SIGNS: ED Triage Vitals  Enc Vitals Group     BP 01/16/16 0908 110/78     Pulse Rate 01/16/16 0908 (!) 110     Resp 01/16/16 0908 16     Temp 01/16/16 0908 98.6 F (37 C)     Temp Source 01/16/16 0908 Oral     SpO2 01/16/16 0908 98 %     Weight 01/16/16 0904 166 lb (75.3 kg)     Height --      Head Circumference --      Peak Flow --      Pain Score 01/16/16 0904 8     Pain Loc --      Pain Edu? --      Excl. in GC? --    Constitutional: Alert and oriented. Well appearing and in no distress. Head: Normocephalic and atraumatic. Cardiovascular: Normal rate, regular rhythm. Normal distal pulses. Respiratory: Normal respiratory effort. No  wheezes/rales/rhonchi. Gastrointestinal: Soft and nontender. No distention. Musculoskeletal: Normal spinal alignment without midline tenderness, spasm, deformity, or step-off. Patient transition from supine to sit without assistance. Negative seated straight leg raise bilaterally. Nontender with normal range of motion in all extremities.  Neurologic: Cranial nerves II through XII grossly intact. Normal LE DTRs bilaterally. Normal toe dorsiflexion on exam. Normal gait without ataxia. Normal speech and language. No gross focal neurologic deficits are appreciated. Skin:  Skin is warm, dry and intact. No rash noted. ____________________________________________  INITIAL IMPRESSION / ASSESSMENT AND PLAN / ED COURSE  Patient with symptoms consistent with left lower extremities sciatica. No indication at this time for repeat imaging as the patient's symptoms are chronic, and intermittent. She is discharged with a prescription for Voltaren and Flexeril dose as directed. She will follow with her primary care provider for referral back to physical therapy. A work note is provided for today as  requested.  Clinical Course    ____________________________________________  FINAL CLINICAL IMPRESSION(S) / ED DIAGNOSES  Final diagnoses:  Sciatica of left side     Lissa HoardJenise V Bacon Aracelia Brinson, PA-C 01/16/16 1119    Jene Everyobert Kinner, MD 01/16/16 1306

## 2016-01-16 NOTE — ED Notes (Signed)
Pt to ed with c/o lower back pain and left leg pain.  Pt states pain since Sept.  Pt states seen by PMD and used meds for muscle spasms without relief.  Pt states pain is intermittent and better when she lays down flat.

## 2016-01-16 NOTE — ED Triage Notes (Signed)
Pt reports lower back pain with radiation of pain into left leg x 3 months. Denies injury.

## 2016-01-16 NOTE — Discharge Instructions (Signed)
Take the prescription meds as directed. Follow-up with your provider for further treatment, including referral to physical therapy.

## 2016-03-06 ENCOUNTER — Other Ambulatory Visit: Payer: Self-pay | Admitting: Family

## 2016-03-06 DIAGNOSIS — Z1231 Encounter for screening mammogram for malignant neoplasm of breast: Secondary | ICD-10-CM

## 2016-03-28 ENCOUNTER — Other Ambulatory Visit: Payer: Self-pay | Admitting: Internal Medicine

## 2016-03-28 DIAGNOSIS — E2839 Other primary ovarian failure: Secondary | ICD-10-CM

## 2016-04-08 ENCOUNTER — Encounter: Payer: Self-pay | Admitting: Gastroenterology

## 2016-04-10 ENCOUNTER — Ambulatory Visit: Payer: Medicare Other | Admitting: Physical Therapy

## 2016-04-11 ENCOUNTER — Ambulatory Visit: Payer: Medicare Other | Admitting: Physical Therapy

## 2016-04-12 ENCOUNTER — Ambulatory Visit: Payer: Medicare Other | Attending: Family Medicine | Admitting: Physical Therapy

## 2016-04-12 DIAGNOSIS — G8929 Other chronic pain: Secondary | ICD-10-CM | POA: Insufficient documentation

## 2016-04-12 DIAGNOSIS — R2689 Other abnormalities of gait and mobility: Secondary | ICD-10-CM | POA: Insufficient documentation

## 2016-04-12 DIAGNOSIS — R293 Abnormal posture: Secondary | ICD-10-CM | POA: Insufficient documentation

## 2016-04-12 DIAGNOSIS — M62838 Other muscle spasm: Secondary | ICD-10-CM | POA: Insufficient documentation

## 2016-04-12 DIAGNOSIS — M6281 Muscle weakness (generalized): Secondary | ICD-10-CM | POA: Insufficient documentation

## 2016-04-12 DIAGNOSIS — M5442 Lumbago with sciatica, left side: Secondary | ICD-10-CM | POA: Diagnosis present

## 2016-04-12 NOTE — Therapy (Signed)
[facility] Outpatient Rehabilitation [facility] [address] [place], [place], [address] Phone: [phone]   Fax:  [phone]  Physical Therapy Evaluation  Patient Details  Name: Destiny Trujillo MRN: [id number] Date of Birth: [birth year] Referring Provider: Fleet Contras MD  Encounter Date: 04/12/2016      PT End of Session - 04/12/16 1242    Visit Number 1   Number of Visits 13   Date for PT Re-Evaluation 05/24/16   Authorization Type Medicare: KX mod by 15th visit, Progress noteby 10th visit   PT Start Time 0846   PT Stop Time 0933   PT Time Calculation (min) 47 min   Activity Tolerance Patient tolerated treatment well   Behavior During Therapy [facility] for tasks assessed/performed      Past Medical History:  Diagnosis Date  . Allergy    seasonal  . Asthma   . Depression   . Diabetes mellitus without complication (HCC)   . Hyperlipidemia   . Hypertension     No past surgical history on file.  There were no vitals filed for this visit.       Subjective Assessment - 04/12/16 0855    Subjective pt is a 51 y.o F with CC of low back pain with LLE referral that started about 3 months ago that start insidously.  pain stays mostly in the low back with referral down the LLE into the toes described as achey numbness. pt denies bowel or bladder incontinence or saddle paresthesia. Since onset the pain as gradually worsened.    Limitations Sitting;Lifting;Standing;Walking;House hold activities   How long can you sit comfortably? 5 min   How long can you stand comfortably? 5 min   How long can you walk comfortably? 5 min   Diagnostic tests x-ray   Patient Stated Goals relieve the pain, to be able to work, to get back to exercising.    Currently in Pain? Yes   Pain Score 7   took pain meds this AM   Pain Location Back   Pain Orientation Mid;Left;Lower   Pain Descriptors / Indicators Burning;Aching   Pain Type Chronic pain   Pain Radiating Towards to  the L foot/ toes   Pain Onset More than a month ago   Pain Frequency Intermittent   Aggravating Factors  Walking/ standing, quick movements   Pain Relieving Factors heating pad, medication            [facility] PT Assessment - 04/12/16 0001      Assessment   Medical Diagnosis Low back pain with sciatica   Referring Provider Fleet Contras MD   Onset Date/Surgical Date --  3 months ago   Hand Dominance Left   Next MD Visit 3 months   Prior Therapy yes     Precautions   Precautions None     Restrictions   Weight Bearing Restrictions No     Balance Screen   Has the patient fallen in the past 6 months No   Has the patient had a decrease in activity level because of a fear of falling?  No   Is the patient reluctant to leave their home because of a fear of falling?  No     Home Environment   Living Environment Private residence   Living Arrangements Children   Available Help at Discharge Family;Other (Comment)   Type of Home Apartment   Home Access Stairs to enter   Entrance Stairs-Number of Steps 3   Entrance Stairs-Rails Can reach  both   Home Layout One level   Home Equipment [place] - single point     Prior Function   Level of Independence Independent with basic ADLs;Requires assistive device for independence   Vocation On disability   Leisure driving, yard sales, shopping,      Cognition   Overall Cognitive Status Within Functional Limits for tasks assessed     Observation/Other Assessments   Focus on Therapeutic Outcomes (FOTO)  99% limited  predicted61% limited     Posture/Postural Control   Posture/Postural Control Postural limitations   Postural Limitations Rounded Shoulders;Forward head     ROM / Strength   AROM / PROM / Strength AROM;Strength     AROM   AROM Assessment Site Lumbar   Lumbar Flexion 40   Lumbar Extension 28  ERP   Lumbar - Right Side Bend 8  ERP    Lumbar - Left Side Bend 12  pain during the movement     Strength   Strength  Assessment Site Hip;Knee   Right/Left Hip Right;Left   Right Hip Flexion 4/5   Right Hip Extension 3+/5   Right Hip ABduction 3+/5   Right Hip ADduction 3+/5   Left Hip Flexion 4/5   Left Hip Extension 3+/5   Left Hip ABduction 3+/5   Left Hip ADduction 3+/5   Right/Left Knee Right;Left   Right Knee Flexion 4/5   Right Knee Extension 4+/5   Left Knee Flexion 4-/5   Left Knee Extension 4-/5     Flexibility   Soft Tissue Assessment /Muscle Length yes  limited external rotation on the R with pain     Palpation   Palpation comment tenderness int he glute med/ max and significant tightness with referred pain with piriformis.  mild tightness in the l lumbar paraspinals     Special Tests    Special Tests Lumbar   Lumbar Tests Slump Test;Prone Knee Bend Test;Straight Leg Raise     Slump test   Findings Positive   Side Left   Comment recreation of symptoms      Straight Leg Raise   Findings Positive   Side  Left  ipsilateral      Ambulation/Gait   Ambulation/Gait Yes   Assistive device Straight cane   Gait Pattern Decreased stride length;Trendelenburg;Antalgic;Lateral hip instability;Step-to pattern                   [facility] Adult PT Treatment/Exercise - 04/12/16 0001      Lumbar Exercises: Stretches   Piriformis Stretch 2 reps;30 seconds     Manual Therapy   Manual therapy comments manual trigger point release over L piriformis  decreased pain in the foot                PT Education - 04/12/16 1242    Education provided Yes   Education Details evaluation findings, POC, goals, anatomy of condition. HEP with proper form/ rationale. How to fit her SPC, and proper use to support the LLE   Person(s) Educated Patient   Methods Explanation;Verbal cues;Handout   Comprehension Verbalized understanding;Verbal cues required          PT Short Term Goals - 04/12/16 1251      PT SHORT TERM GOAL #1   Title pt will be I with inital HEP given (05/06/2016)    Time 3   Period Weeks   Status New     PT SHORT TERM GOAL #2   Title she will verbalize techniques  for proper posture, lifting mechanics and DME use to prevent and reduce low back pain (05/06/2016)   Time 3   Period Weeks   Status New           PT Long Term Goals - 04/12/16 1252      PT LONG TERM GOAL #1   Title she will be I with all HEp given as of last visit (05/24/2016)   Time 6   Period Weeks   Status New     PT LONG TERM GOAL #2   Title she will increase trunk flexion by >/= 10 degrees and bil sidebending by >/= 6 degrees with </=2/10 pain for funcitonal mobility for ADLs (05/24/2016)   Time 6   Period Weeks   Status New     PT LONG TERM GOAL #3   Title She will increase L abductor/ extensor strength to >/= 4/5 to for standing/ walking activities (05/24/2016)   Time 6   Period Weeks   Status New     PT LONG TERM GOAL #4   Title she will be able to sit/ stand and walk >/= 20 minutes with </= 2/10 pain for functional endurance required for ADLs (05/24/2016)   Time 6   Period Weeks   Status New     PT LONG TERM GOAL #5   Title she will increase FOTO score to </= 61% limited to demonstrate improvement in function (05/24/2016)   Time 6   Period Weeks   Status New               Plan - 04/12/16 1243    Clinical Impression Statement Mrs. Vandalen presents to OPPT as a moderate complexity evaluation due to significant PMHx, worsening pain and exam findings regarding CC of Low back pain with LLE referral. she demonstrates limited trunk mobility in all planes. weakness in bil LE with L>R. exhibits antantic trendelberg gait pattern with limited stance on the L using SPC, pain and tightness noted at the piriformis and glute med/ max on the L. She would benefit from physcial therapy to decrease pain, improve strength and mobility to maximze her function  by addressing the deficits listed.    Rehab Potential Good   PT Frequency 2x / week   PT Duration 6 weeks   PT  Treatment/Interventions ADLs/Self Care Home Management;Cryotherapy;Electrical Stimulation;Iontophoresis 4mg /ml Dexamethasone;Moist Heat;Ultrasound;Traction;Therapeutic activities;Therapeutic exercise;Patient/family education;Dry needling;Taping;Manual techniques;Neuromuscular re-education   PT Next Visit Plan assess/review HEP, STM over glute med/ max and piriformis, Discuss DN, hip stretching, hip/core strengthening, Modalities PRN,    PT Home Exercise Plan lower trunk rotation, piriformis stretching, sidelying clams, pelvic tilt   Consulted and Agree with Plan of Care Patient      Patient will benefit from skilled therapeutic intervention in order to improve the following deficits and impairments:  Abnormal gait, Pain, Improper body mechanics, Postural dysfunction, Decreased strength, Decreased mobility, Decreased range of motion, Decreased endurance, Decreased activity tolerance, Increased fascial restricitons, Increased muscle spasms  Visit Diagnosis: Chronic bilateral low back pain with left-sided sciatica - Plan: PT plan of care cert/re-cert  Muscle weakness (generalized) - Plan: PT plan of care cert/re-cert  Abnormal posture - Plan: PT plan of care cert/re-cert  Other abnormalities of gait and mobility - Plan: PT plan of care cert/re-cert  Other muscle spasm - Plan: PT plan of care cert/re-cert      G-Codes - 04/12/16 1256    Functional Assessment Tool Used (Outpatient Only) FOTO/ Clinical judgement   Functional Limitation Mobility:  Walking and moving around   Mobility: Walking and Moving Around Current Status [phone]) At least 60 percent but less than 80 percent impaired, limited or restricted   Mobility: Walking and Moving Around Goal Status [phone]) At least 40 percent but less than 60 percent impaired, limited or restricted       Problem List Patient Active Problem List   Diagnosis Date Noted  . Type 2 diabetes mellitus without complication (HCC) 03/18/2015  . Essential  hypertension 03/18/2015  . Hyperlipidemia 03/18/2015  . Asthma 03/18/2015  . Obesity 03/18/2015   Lulu Riding PT, DPT, LAT, ATC  04/12/16  12:59 PM      [facility] Outpatient Rehabilitation [facility] [address] [place], [place], [address] Phone: [phone]   Fax:  [phone]  Name: Aamira Bischoff MRN: [id number] Date of Birth: Jul 18, 1965

## 2016-04-22 ENCOUNTER — Telehealth: Payer: Self-pay | Admitting: Physical Therapy

## 2016-04-22 ENCOUNTER — Ambulatory Visit: Payer: Medicare Other | Attending: Family Medicine | Admitting: Physical Therapy

## 2016-04-22 DIAGNOSIS — M62838 Other muscle spasm: Secondary | ICD-10-CM | POA: Insufficient documentation

## 2016-04-22 DIAGNOSIS — R293 Abnormal posture: Secondary | ICD-10-CM | POA: Insufficient documentation

## 2016-04-22 DIAGNOSIS — M6281 Muscle weakness (generalized): Secondary | ICD-10-CM | POA: Insufficient documentation

## 2016-04-22 DIAGNOSIS — R2689 Other abnormalities of gait and mobility: Secondary | ICD-10-CM | POA: Insufficient documentation

## 2016-04-22 DIAGNOSIS — M5442 Lumbago with sciatica, left side: Secondary | ICD-10-CM | POA: Insufficient documentation

## 2016-04-22 DIAGNOSIS — G8929 Other chronic pain: Secondary | ICD-10-CM | POA: Insufficient documentation

## 2016-04-22 NOTE — Telephone Encounter (Signed)
Attempted to call patient regarding missed appointment this morning. Both phone numbers are inactive. Will need to remove future appointment if she has another no-show.

## 2016-04-23 ENCOUNTER — Ambulatory Visit: Payer: Medicare Other | Admitting: Physical Therapy

## 2016-04-23 DIAGNOSIS — M62838 Other muscle spasm: Secondary | ICD-10-CM | POA: Diagnosis present

## 2016-04-23 DIAGNOSIS — R293 Abnormal posture: Secondary | ICD-10-CM | POA: Diagnosis present

## 2016-04-23 DIAGNOSIS — G8929 Other chronic pain: Secondary | ICD-10-CM | POA: Diagnosis present

## 2016-04-23 DIAGNOSIS — R2689 Other abnormalities of gait and mobility: Secondary | ICD-10-CM | POA: Diagnosis present

## 2016-04-23 DIAGNOSIS — M5442 Lumbago with sciatica, left side: Secondary | ICD-10-CM | POA: Diagnosis present

## 2016-04-23 DIAGNOSIS — M6281 Muscle weakness (generalized): Secondary | ICD-10-CM | POA: Diagnosis present

## 2016-04-23 NOTE — Patient Instructions (Signed)
Hip Adduction: Leg Lift (Eccentric) - Side-Lying    Lie on side with top leg bent, foot flat behind lower leg. Quickly lift lower leg. Slowly lower for 3-5 seconds. 10___ reps per set, _2__ sets per day, __7_ days per week. Add ___ lbs when you achieve ___ repetitions.  Bridge    Lie back, legs bent. Inhale, pressing hips up. Keeping ribs in, lengthen lower back. Exhale, rolling down along spine from top. Repeat ___10-20_ times. Do ___2_ sessions per day.

## 2016-04-23 NOTE — Therapy (Signed)
[facility] Outpatient Rehabilitation [facility] [address] [place], [place], [address] Phone: [phone]   Fax:  [phone]  Physical Therapy Treatment  Patient Details  Name: Destiny Trujillo MRN: [id number] Date of Birth: 1965-11-08 Referring Provider: Fleet Contras MD  Encounter Date: 04/23/2016      PT End of Session - 04/23/16 1106    Visit Number 2   Number of Visits 13   Date for PT Re-Evaluation 05/24/16   Authorization Type Medicare: KX mod by 15th visit, Progress noteby 10th visit   PT Start Time 1102   PT Stop Time 1145   PT Time Calculation (min) 43 min      Past Medical History:  Diagnosis Date  . Allergy    seasonal  . Asthma   . Depression   . Diabetes mellitus without complication (HCC)   . Hyperlipidemia   . Hypertension     No past surgical history on file.  There were no vitals filed for this visit.      Subjective Assessment - 04/23/16 1105    Subjective It's a little better    Currently in Pain? Yes   Pain Score 4    Pain Location Back   Pain Orientation Mid;Left;Lower   Pain Descriptors / Indicators Aching;Burning   Pain Radiating Towards down to calf    Aggravating Factors  walking, standing   Pain Relieving Factors laying down with heat                          [facility] Adult PT Treatment/Exercise - 04/23/16 0001      Lumbar Exercises: Stretches   Lower Trunk Rotation Limitations 10 x 2    Pelvic Tilt 10 seconds   Piriformis Stretch 3 reps;30 seconds     Lumbar Exercises: Supine   Bridge 10 reps  2 sets      Lumbar Exercises: Sidelying   Clam 20 reps   Hip Abduction Limitations unable to left, pain   Other Sidelying Lumbar Exercises hip adduction left x10,      Manual Therapy   Manual therapy comments manual trigger point release over L piriformis, glute med, also massage roller, tennis ball   decreased pain in the foot                PT Education - 04/23/16 1145    Education provided Yes   Education Details HEP    Person(s) Educated Patient   Methods Explanation;Handout   Comprehension Verbalized understanding          PT Short Term Goals - 04/23/16 1124      PT SHORT TERM GOAL #1   Title pt will be I with inital HEP given (05/06/2016)   Baseline requires minimal cues    Time 3   Period Weeks   Status On-going     PT SHORT TERM GOAL #2   Title she will verbalize techniques for proper posture, lifting mechanics and DME use to prevent and reduce low back pain (05/06/2016)   Time 3   Period Weeks   Status On-going           PT Long Term Goals - 04/12/16 1252      PT LONG TERM GOAL #1   Title she will be I with all HEp given as of last visit (05/24/2016)   Time 6   Period Weeks   Status New     PT LONG TERM GOAL #2  Title she will increase trunk flexion by >/= 10 degrees and bil sidebending by >/= 6 degrees with </=2/10 pain for funcitonal mobility for ADLs (05/24/2016)   Time 6   Period Weeks   Status New     PT LONG TERM GOAL #3   Title She will increase L abductor/ extensor strength to >/= 4/5 to for standing/ walking activities (05/24/2016)   Time 6   Period Weeks   Status New     PT LONG TERM GOAL #4   Title she will be able to sit/ stand and walk >/= 20 minutes with </= 2/10 pain for functional endurance required for ADLs (05/24/2016)   Time 6   Period Weeks   Status New     PT LONG TERM GOAL #5   Title she will increase FOTO score to </= 61% limited to demonstrate improvement in function (05/24/2016)   Time 6   Period Weeks   Status New               Plan - 04/23/16 1143    Clinical Impression Statement Pt reports pain decreased to 4/10 since first visit. She is consistent with her HEP and thinks the exercises have been helpful. her radicular pain is going to her calf, no longer down to her toes. Review of HEP with pt requiring minimal cues. Advanced hip strengthening to include adduction and extension  strengthening, updated HEP. Began gentle soft tissue work with pain reduced to 2/10 at end of treatment. Pt given information on TPDN.    PT Next Visit Plan assess/review HEP, STM over glute med/ max and piriformis, Discuss DN, hip stretching, hip/core strengthening, Modalities PRN, GAIT, body mechanics    PT Home Exercise Plan lower trunk rotation, piriformis stretching, sidelying clams, pelvic tilt, side lying hip adduction, bridge    Consulted and Agree with Plan of Care Patient      Patient will benefit from skilled therapeutic intervention in order to improve the following deficits and impairments:  Abnormal gait, Pain, Improper body mechanics, Postural dysfunction, Decreased strength, Decreased mobility, Decreased range of motion, Decreased endurance, Decreased activity tolerance, Increased fascial restricitons, Increased muscle spasms  Visit Diagnosis: Chronic bilateral low back pain with left-sided sciatica  Muscle weakness (generalized)  Abnormal posture  Other abnormalities of gait and mobility  Other muscle spasm     Problem List Patient Active Problem List   Diagnosis Date Noted  . Type 2 diabetes mellitus without complication (HCC) 03/18/2015  . Essential hypertension 03/18/2015  . Hyperlipidemia 03/18/2015  . Asthma 03/18/2015  . Obesity 03/18/2015    Sherrie Mustache, PTA 04/23/2016, 1:07 PM  [facility] [address] [place], [place], [address] Phone: [phone]   Fax:  [phone]  Name: Porchia Sinkler MRN: [id number] Date of Birth: 1965/09/03

## 2016-04-25 ENCOUNTER — Ambulatory Visit: Payer: Medicare Other | Admitting: Physical Therapy

## 2016-04-29 ENCOUNTER — Ambulatory Visit: Payer: Medicare Other | Admitting: Physical Therapy

## 2016-05-02 ENCOUNTER — Ambulatory Visit: Payer: Medicare Other | Admitting: Physical Therapy

## 2016-05-06 ENCOUNTER — Ambulatory Visit: Payer: Medicare Other | Admitting: Physical Therapy

## 2016-05-09 ENCOUNTER — Encounter: Payer: Self-pay | Admitting: Physical Therapy

## 2016-05-09 ENCOUNTER — Ambulatory Visit: Payer: Medicare Other | Admitting: Physical Therapy

## 2016-05-09 DIAGNOSIS — M62838 Other muscle spasm: Secondary | ICD-10-CM

## 2016-05-09 DIAGNOSIS — G8929 Other chronic pain: Secondary | ICD-10-CM

## 2016-05-09 DIAGNOSIS — M6281 Muscle weakness (generalized): Secondary | ICD-10-CM

## 2016-05-09 DIAGNOSIS — R2689 Other abnormalities of gait and mobility: Secondary | ICD-10-CM

## 2016-05-09 DIAGNOSIS — M5442 Lumbago with sciatica, left side: Secondary | ICD-10-CM | POA: Diagnosis not present

## 2016-05-09 DIAGNOSIS — R293 Abnormal posture: Secondary | ICD-10-CM

## 2016-05-09 NOTE — Therapy (Addendum)
[place], [place], [address] Phone: [phone]   Fax:  [phone]  Physical Therapy Treatment / Discharge Note   Patient Details  Name: Destiny Trujillo MRN: [id number] Date of Birth: Jan 28, 1965 Referring Provider: Nolene Ebbs MD  Encounter Date: 05/09/2016      PT End of Session - 05/09/16 1247    Visit Number 3   Number of Visits 13   Date for PT Re-Evaluation 05/24/16   Authorization Type Medicare: KX mod by 15th visit, Progress noteby 10th visit   PT Start Time 0930   PT Stop Time 1015   PT Time Calculation (min) 45 min   Activity Tolerance Patient tolerated treatment well   Behavior During Therapy [facility] for tasks assessed/performed      Past Medical History:  Diagnosis Date  . Allergy    seasonal  . Asthma   . Depression   . Diabetes mellitus without complication ([place])   . Hyperlipidemia   . Hypertension     History reviewed. No pertinent surgical history.  There were no vitals filed for this visit.      Subjective Assessment - 05/09/16 0935    Subjective Not experiencing pain right now. Was about a 5/10 before took pain meds. Patient stated that she was feeling much better and that she has been doing her HEP regularly.   Currently in Pain? No/denies  was a 5/10 before med   Aggravating Factors  standing, sitting for greater than 5 mins   Pain Relieving Factors changing positions                         [facility] Adult PT Treatment/Exercise - 05/09/16 0001      Lumbar Exercises: Stretches   Lower Trunk Rotation Limitations 10 x 2 with and without green theraband resistance    Piriformis Stretch 3 reps;30 seconds     Lumbar Exercises: Supine   Bridge 10 reps  2 sets    Other Supine Lumbar Exercises bridge with clam shell red x 10     Lumbar Exercises: Sidelying   Clam 20 reps   Clam Limitations 10 without resistance; 10 with red    Other Sidelying  Lumbar Exercises hip adduction both x10,      Knee/Hip Exercises: Aerobic   Nustep L 2 5 minutes LE only                PT Education - 05/09/16 1247    Education provided Yes   Education Details HEP; difference between stretching and strengthening exercises   Person(s) Educated Patient   Methods Explanation;Demonstration;Verbal cues   Comprehension Verbalized understanding;Returned demonstration          PT Short Term Goals - 04/23/16 1124      PT SHORT TERM GOAL #1   Title pt will be I with inital HEP given (05/06/2016)   Baseline requires minimal cues    Time 3   Period Weeks   Status On-going     PT SHORT TERM GOAL #2   Title she will verbalize techniques for proper posture, lifting mechanics and DME use to prevent and reduce low back pain (05/06/2016)   Time 3   Period Weeks   Status On-going           PT Long Term Goals - 04/12/16 1252      PT LONG TERM GOAL #1   Title she will be I with all  HEp given as of last visit (05/24/2016)   Time 6   Period Weeks   Status New     PT LONG TERM GOAL #2   Title she will increase trunk flexion by >/= 10 degrees and bil sidebending by >/= 6 degrees with </=2/10 pain for funcitonal mobility for ADLs (05/24/2016)   Time 6   Period Weeks   Status New     PT LONG TERM GOAL #3   Title She will increase L abductor/ extensor strength to >/= 4/5 to for standing/ walking activities (05/24/2016)   Time 6   Period Weeks   Status New     PT LONG TERM GOAL #4   Title she will be able to sit/ stand and walk >/= 20 minutes with </= 2/10 pain for functional endurance required for ADLs (05/24/2016)   Time 6   Period Weeks   Status New     PT LONG TERM GOAL #5   Title she will increase FOTO score to </= 61% limited to demonstrate improvement in function (05/24/2016)   Time 6   Period Weeks   Status New               Plan - 05/09/16 1248    Clinical Impression Statement Pt reports overall pain has decreased and is  mostly felt in the morning. She also still experiences pain if she maintains a position for an extended period of time. She was not experiencing pain during treatment. She is consistent with her HEP and can perform her HEP with minimal cues. Advanced hip strengthening to include red theraband to side-lying clam shells and bridges; pt stated she liked the way theraband exercises felt. Instructed on correct time length for stretches in her HEP.     Rehab Potential Good   PT Frequency 2x / week   PT Duration 6 weeks   PT Treatment/Interventions ADLs/Self Care Home Management;Cryotherapy;Electrical Stimulation;Iontophoresis 39m/ml Dexamethasone;Moist Heat;Ultrasound;Traction;Therapeutic activities;Therapeutic exercise;Patient/family education;Dry needling;Taping;Manual techniques;Neuromuscular re-education   PT Next Visit Plan assess/review HEP, STM over glute med/ max and piriformis, Discuss DN, hip stretching, hip/core strengthening, Modalities PRN, GAIT, body mechanics    PT Home Exercise Plan lower trunk rotation, piriformis stretching, sidelying clams with theraband, pelvic tilt, side lying hip adduction, bridge with clams with theraband   Consulted and Agree with Plan of Care Patient      Patient will benefit from skilled therapeutic intervention in order to improve the following deficits and impairments:  Abnormal gait, Pain, Improper body mechanics, Postural dysfunction, Decreased strength, Decreased mobility, Decreased range of motion, Decreased endurance, Decreased activity tolerance, Increased fascial restricitons, Increased muscle spasms  Visit Diagnosis: Chronic bilateral low back pain with left-sided sciatica  Muscle weakness (generalized)  Abnormal posture  Other abnormalities of gait and mobility  Other muscle spasm     Problem List Patient Active Problem List   Diagnosis Date Noted  . Type 2 diabetes mellitus without complication ([place] [place] 051/02/5850 . Essential hypertension  03/18/2015  . Hyperlipidemia 03/18/2015  . Asthma 03/18/2015  . Obesity 03/18/2015    RJanna Arch SPTA 05/09/2016, 12:56 PM  [facility] [address]Phone: [phone]  Fax:  [phone] Name: JKyrstin CampilloMRN: [id number] of Birth: [birth year]    PHYSICAL THERAPY DISCHARGE SUMMARY  Visits from Start of Care: 3  Current functional level related to goals / functional outcomes: unknown   Remaining deficits: Unknown due to pt not returning  Education / Equipment: HEP,   Plan: Patient agrees to discharge.  Patient goals were not met. Patient is being discharged due to not returning since the last visit.  ?????      Kristoffer Leamon PT, DPT, LAT, ATC  05/27/16  1:22 PM

## 2016-05-13 ENCOUNTER — Ambulatory Visit: Payer: Medicare Other | Admitting: Physical Therapy

## 2016-05-16 ENCOUNTER — Ambulatory Visit: Payer: Medicare Other | Admitting: Physical Therapy

## 2016-05-20 ENCOUNTER — Telehealth: Payer: Self-pay | Admitting: *Deleted

## 2016-05-20 NOTE — Telephone Encounter (Signed)
Patient no show pv today. Called both numbers listed, cell # not taking calls and home # invalid. Cancelled appointments and sent no show letter to patient's home.

## 2016-05-21 ENCOUNTER — Encounter: Payer: Self-pay | Admitting: Internal Medicine

## 2016-05-27 ENCOUNTER — Ambulatory Visit: Payer: Medicare Other | Attending: Family Medicine | Admitting: Physical Therapy

## 2016-05-29 ENCOUNTER — Other Ambulatory Visit: Payer: Self-pay

## 2016-06-03 ENCOUNTER — Encounter: Payer: Medicare Other | Admitting: Gastroenterology

## 2016-07-10 ENCOUNTER — Other Ambulatory Visit: Payer: Self-pay | Admitting: Family

## 2016-07-10 DIAGNOSIS — Z1231 Encounter for screening mammogram for malignant neoplasm of breast: Secondary | ICD-10-CM

## 2016-07-30 ENCOUNTER — Ambulatory Visit: Payer: Medicare Other

## 2016-07-30 ENCOUNTER — Other Ambulatory Visit: Payer: Medicare Other

## 2016-09-25 ENCOUNTER — Ambulatory Visit: Payer: Medicare Other | Admitting: Cardiovascular Disease

## 2016-11-14 ENCOUNTER — Ambulatory Visit: Payer: Medicare Other | Admitting: Cardiovascular Disease

## 2016-11-15 ENCOUNTER — Encounter: Payer: Self-pay | Admitting: Cardiovascular Disease

## 2017-01-27 ENCOUNTER — Other Ambulatory Visit: Payer: Self-pay | Admitting: Internal Medicine

## 2017-01-27 ENCOUNTER — Encounter: Payer: Self-pay | Admitting: Internal Medicine

## 2017-01-27 DIAGNOSIS — Z1231 Encounter for screening mammogram for malignant neoplasm of breast: Secondary | ICD-10-CM

## 2017-01-28 ENCOUNTER — Other Ambulatory Visit: Payer: Self-pay | Admitting: Family Medicine

## 2017-02-04 ENCOUNTER — Encounter: Payer: Self-pay | Admitting: Internal Medicine

## 2017-02-04 ENCOUNTER — Ambulatory Visit (INDEPENDENT_AMBULATORY_CARE_PROVIDER_SITE_OTHER): Payer: Medicare Other | Admitting: Internal Medicine

## 2017-02-04 ENCOUNTER — Other Ambulatory Visit (INDEPENDENT_AMBULATORY_CARE_PROVIDER_SITE_OTHER): Payer: Medicare Other

## 2017-02-04 VITALS — BP 110/68 | HR 78 | Temp 98.4°F | Ht 59.0 in | Wt 178.0 lb

## 2017-02-04 DIAGNOSIS — E785 Hyperlipidemia, unspecified: Secondary | ICD-10-CM

## 2017-02-04 DIAGNOSIS — E119 Type 2 diabetes mellitus without complications: Secondary | ICD-10-CM

## 2017-02-04 DIAGNOSIS — E1169 Type 2 diabetes mellitus with other specified complication: Secondary | ICD-10-CM | POA: Diagnosis not present

## 2017-02-04 DIAGNOSIS — Z794 Long term (current) use of insulin: Secondary | ICD-10-CM | POA: Diagnosis not present

## 2017-02-04 DIAGNOSIS — I10 Essential (primary) hypertension: Secondary | ICD-10-CM

## 2017-02-04 DIAGNOSIS — J45909 Unspecified asthma, uncomplicated: Secondary | ICD-10-CM

## 2017-02-04 LAB — LIPID PANEL
CHOL/HDL RATIO: 3
Cholesterol: 148 mg/dL (ref 0–200)
HDL: 55.7 mg/dL (ref >39.00)
LDL Cholesterol: 75 mg/dL (ref 0–99)
NONHDL: 92.26
TRIGLYCERIDES: 88 mg/dL (ref 0.0–149.0)
VLDL: 17.6 mg/dL (ref 0.0–40.0)

## 2017-02-04 LAB — MICROALBUMIN / CREATININE URINE RATIO
Creatinine,U: 77.2 mg/dL
Microalb Creat Ratio: 0.9 mg/g (ref 0.0–30.0)
Microalb, Ur: <0.7 mg/dL (ref 0.0–1.9)

## 2017-02-04 LAB — CBC
HEMATOCRIT: 39 % (ref 36.0–46.0)
HEMOGLOBIN: 12.8 g/dL (ref 12.0–15.0)
MCHC: 32.7 g/dL (ref 30.0–36.0)
MCV: 80 fl (ref 78.0–100.0)
Platelets: 296 10*3/uL (ref 150.0–400.0)
RBC: 4.88 Mil/uL (ref 3.87–5.11)
RDW: 15.3 % (ref 11.5–15.5)
WBC: 7.6 10*3/uL (ref 4.0–10.5)

## 2017-02-04 LAB — COMPREHENSIVE METABOLIC PANEL
ALT: 12 U/L (ref 0–35)
AST: 13 U/L (ref 0–37)
Albumin: 4.3 g/dL (ref 3.5–5.2)
Alkaline Phosphatase: 70 U/L (ref 39–117)
BUN: 11 mg/dL (ref 6–23)
CHLORIDE: 102 meq/L (ref 96–112)
CO2: 30 mEq/L (ref 19–32)
Calcium: 9.8 mg/dL (ref 8.4–10.5)
Creatinine, Ser: 0.77 mg/dL (ref 0.40–1.20)
GFR: 101.05 mL/min (ref >60.00)
GLUCOSE: 139 mg/dL — AB (ref 70–99)
POTASSIUM: 4.1 meq/L (ref 3.5–5.1)
SODIUM: 139 meq/L (ref 135–145)
Total Bilirubin: 0.3 mg/dL (ref 0.2–1.2)
Total Protein: 7.7 g/dL (ref 6.0–8.3)

## 2017-02-04 LAB — POCT EXHALED NITRIC OXIDE: FeNO level (ppb): 9

## 2017-02-04 LAB — HEMOGLOBIN A1C: HEMOGLOBIN A1C: 7 % — AB (ref 4.6–6.5)

## 2017-02-04 LAB — TSH: TSH: 0.68 u[IU]/mL (ref 0.35–4.50)

## 2017-02-04 NOTE — Progress Notes (Signed)
   Subjective:    Patient ID: Destiny MillinerJanice Trujillo Trujillo, female    DOB: 08/11/1965, 52 y.o.   MRN: [id number]  HPI The patient is a 52 YO female coming in for follow up of her medical conditions and to re-establish at our clinic. She has seen several different providers in the last two years. She needs to follow up regarding her diabetes (no visit in more than 1 year, not controlled previously, she is taking lantus 50 units qhs, metformin and , she denies) and her cholesterol (supposed to be taking simvastatin but poor compliance as no rx from us in 2 years or so and has not asked for refills, denies chest pains or SOB) and her blood pressure (supposed to be taking lisinopril/hctz and no refills in more than 1 year without requesting refills, denies headaches or chest pains) and her asthma (supposed to be taking advair and albuterol as needed, none prescribed in more than 1-2 years, not taking regularly, denies flare recently). She has switched providers numerous times since our last visit and was dismissed from at least 1 office.   PMH, [facility], social history reviewed and updated.   Review of Systems  Constitutional: Negative.   HENT: Negative.   Eyes: Negative.   Respiratory: Negative for cough, chest tightness and shortness of breath.   Cardiovascular: Negative for chest pain, palpitations and leg swelling.  Gastrointestinal: Negative for abdominal distention, abdominal pain, constipation, diarrhea, nausea and vomiting.  Musculoskeletal: Negative.   Skin: Negative.   Neurological: Negative.   Psychiatric/Behavioral: Negative.       Objective:   Physical Exam  Constitutional: She is oriented to person, place, and time. She appears well-developed and well-nourished.  Overweight  HENT:  Head: Normocephalic and atraumatic.  Eyes: EOM are normal.  Neck: Normal range of motion.  Cardiovascular: Normal rate and regular rhythm.  Pulmonary/Chest: Effort normal and breath sounds normal. No  respiratory distress. She has no wheezes. She has no rales.  Abdominal: Soft. Bowel sounds are normal. She exhibits no distension. There is no tenderness. There is no rebound.  Musculoskeletal: She exhibits no edema.  Neurological: She is alert and oriented to person, place, and time. Coordination normal.  Skin: Skin is warm and dry.  Foot exam done  Psychiatric: She has a normal mood and affect.   Vitals:   02/04/17 0908  BP: 110/68  Pulse: 78  Temp: 98.4 F (36.9 C)  TempSrc: Oral  SpO2: 99%  Weight: 178 lb (80.7 kg)  Height: 4\' 11"  (1.499 m)      Assessment & Plan:

## 2017-02-04 NOTE — Patient Instructions (Addendum)
The breathing test showed that you asthma is under good control. We do not need to change the medicines. We will recheck that next time and if it is still good we may even be able to decrease the asthma medicine.   We would like you to decrease the lantus to 40 units at night time. If you are still having sugars of 70 in the morning call us. We are checking the sugar levels today and may need to make changes to the plan and will call and let you know.   We would like to see you back in 3 months and once the labs are back will send in refills of the medicine until then.    Diabetes Mellitus and Standards of Medical Care Managing diabetes (diabetes mellitus) can be complicated. Your diabetes treatment may be managed by a team of health care providers, including:  A diet and nutrition specialist (registered dietitian).  A nurse.  A certified diabetes educator (CDE).  A diabetes specialist (endocrinologist).  An eye doctor.  A primary care provider.  A dentist.  Your health care providers follow a schedule in order to help you get the best quality of care. The following schedule is a general guideline for your diabetes management plan. Your health care providers may also give you more specific instructions. HbA1c ( hemoglobin A1c) test This test provides information about blood sugar (glucose) control over the previous 2-3 months. It is used to check whether your diabetes management plan needs to be adjusted.  If you are meeting your treatment goals, this test is done at least 2 times a year.  If you are not meeting treatment goals or if your treatment goals have changed, this test is done 4 times a year.  Blood pressure test  This test is done at every routine medical visit. For most people, the goal is less than 130/80. Ask your health care provider what your goal blood pressure should be. Dental and eye exams  Visit your dentist two times a year.  If you have type 1 diabetes,  get an eye exam 3-5 years after you are diagnosed, and then once a year after your first exam. ? If you were diagnosed with type 1 diabetes as a child, get an eye exam when you are age 54 or older and have had diabetes for 3-5 years. After the first exam, you should get an eye exam once a year.  If you have type 2 diabetes, have an eye exam as soon as you are diagnosed, and then once a year after your first exam. Foot care exam  Visual foot exams are done at every routine medical visit. The exams check for cuts, bruises, redness, blisters, sores, or other problems with the feet.  A complete foot exam is done by your health care provider once a year. This exam includes an inspection of the structure and skin of your feet, and a check of the pulses and sensation in your feet. ? Type 1 diabetes: Get your first exam 3-5 years after diagnosis. ? Type 2 diabetes: Get your first exam as soon as you are diagnosed.  Check your feet every day for cuts, bruises, redness, blisters, or sores. If you have any of these or other problems that are not healing, contact your health care provider. Kidney function test ( urine microalbumin)  This test is done once a year. ? Type 1 diabetes: Get your first test 5 years after diagnosis. ? Type 2 diabetes: Get  your first test as soon as you are diagnosed.  If you have chronic kidney disease (CKD), get a serum creatinine and estimated glomerular filtration rate (eGFR) test once a year. Lipid profile (cholesterol, HDL, LDL, triglycerides)  This test should be done when you are diagnosed with diabetes, and every 5 years after the first test. If you are on medicines to lower your cholesterol, you may need to get this test done every year. ? The goal for LDL is less than 100 mg/dL (5.5 mmol/L). If you are at high risk, the goal is less than 70 mg/dL (3.9 mmol/L). ? The goal for HDL is 40 mg/dL (2.2 mmol/L) for men and 50 mg/dL(2.8 mmol/L) for women. An HDL cholesterol  of 60 mg/dL (3.3 mmol/L) or higher gives some protection against heart disease. ? The goal for triglycerides is less than 150 mg/dL (8.3 mmol/L). Immunizations  The yearly flu (influenza) vaccine is recommended for everyone 6 months or older who has diabetes.  The pneumonia (pneumococcal) vaccine is recommended for everyone 2 years or older who has diabetes. If you are 7 or older, you may get the pneumonia vaccine as a series of two separate shots.  The hepatitis B vaccine is recommended for adults shortly after they have been diagnosed with diabetes.  The Tdap (tetanus, diphtheria, and pertussis) vaccine should be given: ? According to normal childhood vaccination schedules, for children. ? Every 10 years, for adults who have diabetes.  The shingles vaccine is recommended for people who have had chicken pox and are 50 years or older. Mental and emotional health  Screening for symptoms of eating disorders, anxiety, and depression is recommended at the time of diagnosis and afterward as needed. If your screening shows that you have symptoms (you have a positive screening result), you may need further evaluation and be referred to a mental health care provider. Diabetes self-management education  Education about how to manage your diabetes is recommended at diagnosis and ongoing as needed. Treatment plan  Your treatment plan will be reviewed at every medical visit. Summary  Managing diabetes (diabetes mellitus) can be complicated. Your diabetes treatment may be managed by a team of health care providers.  Your health care providers follow a schedule in order to help you get the best quality of care.  Standards of care including having regular physical exams, blood tests, blood pressure monitoring, immunizations, screening tests, and education about how to manage your diabetes.  Your health care providers may also give you more specific instructions based on your individual  health. This information is not intended to replace advice given to you by your health care provider. Make sure you discuss any questions you have with your health care provider. Document Released: 11/04/2008 Document Revised: 10/06/2015 Document Reviewed: 10/06/2015 Elsevier Interactive Patient Education  [employer].

## 2017-02-06 NOTE — Assessment & Plan Note (Signed)
Reminded about need for eye exam, foot exam done today. She is currently taking metformin and lantus 50 units. She is having low sugars and we asked her to decrease lantus to 40 units at night time. The low sugars were keeping her from taking her second metformin daily. Would rather she take the metformin than lantus. She is no longer on bydureon for unknown reason.

## 2017-02-06 NOTE — Assessment & Plan Note (Signed)
Checking lipid panel anad adjust simvastatin 20 mg daily for goal LDL <100.

## 2017-02-06 NOTE — Assessment & Plan Note (Signed)
States she is taking lisinopril/hctz and BP is at goal today. Checking CMP and adjust if needed.

## 2017-02-06 NOTE — Assessment & Plan Note (Signed)
BMI 35 but complicated by diabetes, hypertension, hyperlipidemia. She is reminded about the need to lose weight and she does not feel able to focus on that right now.

## 2017-02-06 NOTE — Assessment & Plan Note (Signed)
Taking advair and albuterol prn but is taking duonebs twice a day scheduled. Have asked her to stop using this. Feno done today at 9 in the office which indicates good control and no indication for need for twice daily duoneb. She is asked to start using flonase daily as well as her claritin as I suspect her allergic disease is causing some perception of breathing difficulty at night time.

## 2017-02-10 ENCOUNTER — Telehealth: Payer: Self-pay | Admitting: Internal Medicine

## 2017-02-10 NOTE — Telephone Encounter (Signed)
Copied from CRM [phone]. Topic: Quick Communication - Lab Results >> Feb 07, 2017  1:40 PM Josetta HuddleGulch, Briana R, [place] wrote: Called patient to inform them of 02/04/2017 lab results. When patient returns call, triage nurse may disclose results. >> Feb 10, 2017 10:02 AM Eston Mouldavis, Shantele Reller B wrote: PT called for lab results,  NT not available at the time- informed pt a nurse would call her back with those results.

## 2017-02-10 NOTE — Telephone Encounter (Signed)
Returned call to pt. Reviewed Dr. Frutoso Chaserawford's notes from her lab results; see result note.

## 2017-02-12 ENCOUNTER — Ambulatory Visit (INDEPENDENT_AMBULATORY_CARE_PROVIDER_SITE_OTHER): Payer: Medicare Other | Admitting: Internal Medicine

## 2017-02-12 ENCOUNTER — Encounter: Payer: Self-pay | Admitting: Internal Medicine

## 2017-02-12 DIAGNOSIS — J4531 Mild persistent asthma with (acute) exacerbation: Secondary | ICD-10-CM | POA: Diagnosis not present

## 2017-02-12 MED ORDER — LORATADINE 10 MG PO TABS: 10.0000 mg | ORAL_TABLET | Freq: Every day | ORAL | 3 refills | Status: DC

## 2017-02-12 MED ORDER — SIMVASTATIN 20 MG PO TABS: 20.0000 mg | ORAL_TABLET | Freq: Every day | ORAL | 3 refills | Status: DC

## 2017-02-12 MED ORDER — LISINOPRIL-HYDROCHLOROTHIAZIDE 20-12.5 MG PO TABS: 1.0000 | ORAL_TABLET | Freq: Every day | ORAL | 3 refills | Status: DC

## 2017-02-12 MED ORDER — FLUTICASONE-SALMETEROL 500-50 MCG/DOSE IN AEPB: 1.0000 | INHALATION_SPRAY | Freq: Two times a day (BID) | RESPIRATORY_TRACT | 1 refills | Status: DC

## 2017-02-12 MED ORDER — PREDNISONE 20 MG PO TABS: 40.0000 mg | ORAL_TABLET | Freq: Every day | ORAL | 0 refills | Status: DC

## 2017-02-12 MED ORDER — FLUTICASONE PROPIONATE 50 MCG/ACT NA SUSP: 2.0000 | Freq: Every day | NASAL | 6 refills | Status: DC

## 2017-02-12 MED ORDER — METFORMIN HCL 1000 MG PO TABS: ORAL_TABLET | ORAL | 3 refills | Status: DC

## 2017-02-12 MED ORDER — INSULIN GLARGINE 100 UNIT/ML SOLOSTAR PEN: 30.0000 [IU] | PEN_INJECTOR | Freq: Every day | SUBCUTANEOUS | 6 refills | Status: DC

## 2017-02-12 NOTE — Patient Instructions (Addendum)
We have sent in the prednisone to start taking in 2-3 days if you are not getting better. Start taking it sooner if your breathing gets worse. If you need to take it, take 2 pills daily for 5 days.  If you are not better next week call us back.   Upper Respiratory Infection, Adult Most upper respiratory infections (URIs) are caused by a virus. A URI affects the nose, throat, and upper air passages. The most common type of URI is often called "the common cold." Follow these instructions at home:  Take medicines only as told by your doctor.  Gargle warm saltwater or take cough drops to comfort your throat as told by your doctor.  Use a warm mist humidifier or inhale steam from a shower to increase air moisture. This may make it easier to breathe.  Drink enough fluid to keep your pee (urine) clear or pale yellow.  Eat soups and other clear broths.  Have a healthy diet.  Rest as needed.  Go back to work when your fever is gone or your doctor says it is okay. ? You may need to stay home longer to avoid giving your URI to others. ? You can also wear a face mask and wash your hands often to prevent spread of the virus.  Use your inhaler more if you have asthma.  Do not use any tobacco products, including cigarettes, chewing tobacco, or electronic cigarettes. If you need help quitting, ask your doctor. Contact a doctor if:  You are getting worse, not better.  Your symptoms are not helped by medicine.  You have chills.  You are getting more short of breath.  You have brown or red mucus.  You have yellow or brown discharge from your nose.  You have pain in your face, especially when you bend forward.  You have a fever.  You have puffy (swollen) neck glands.  You have pain while swallowing.  You have white areas in the back of your throat. Get help right away if:  You have very bad or constant: ? Headache. ? Ear pain. ? Pain in your forehead, behind your eyes, and over  your cheekbones (sinus pain). ? Chest pain.  You have long-lasting (chronic) lung disease and any of the following: ? Wheezing. ? Long-lasting cough. ? Coughing up blood. ? A change in your usual mucus.  You have a stiff neck.  You have changes in your: ? Vision. ? Hearing. ? Thinking. ? Mood. This information is not intended to replace advice given to you by your health care provider. Make sure you discuss any questions you have with your health care provider. Document Released: 06/26/2007 Document Revised: 09/10/2015 Document Reviewed: 04/14/2013 Elsevier Interactive Patient Education  2018 [employer]

## 2017-02-12 NOTE — Progress Notes (Signed)
   Subjective:    Patient ID: Destiny Trujillo, female    DOB: 08/11/1965, 52 y.o.   MRN: [id number]  HPI The patient is a 52 YO female coming in for possible asthma flare. Since our last visit she has developed chills, muscle aches, low grade fever. Taking tylenol for that. She is having some more SOB and cough and using her albuterol inhaler more often. She denies significant congestion although she is having some more post nasal drip. Started 3-4 days ago and overall worsening. Denies known sick contacts. Still using her advair.   Review of Systems  Constitutional: Positive for activity change, appetite change and chills. Negative for fatigue, fever and unexpected weight change.  HENT: Positive for congestion and postnasal drip. Negative for ear discharge, ear pain, rhinorrhea, sinus pressure, sinus pain, sneezing, sore throat, tinnitus, trouble swallowing and voice change.   Eyes: Negative.   Respiratory: Positive for cough, shortness of breath and wheezing. Negative for chest tightness.   Cardiovascular: Negative.   Gastrointestinal: Negative.   Musculoskeletal: Positive for myalgias.  Neurological: Negative.       Objective:   Physical Exam  Constitutional: She is oriented to person, place, and time. She appears well-developed and well-nourished.  HENT:  Head: Normocephalic and atraumatic.  Oropharynx with redness and clear drainage, nose with swollen turbinates, TMs normal bilaterally  Eyes: EOM are normal.  Neck: Normal range of motion. No thyromegaly present.  Cardiovascular: Normal rate and regular rhythm.  Pulmonary/Chest: Effort normal. No respiratory distress. She has wheezes. She has no rales.  Diffuse wheezing which partially clears with coughing.   Abdominal: Soft.  Musculoskeletal: She exhibits tenderness.  Lymphadenopathy:    She has no cervical adenopathy.  Neurological: She is alert and oriented to person, place, and time.  Skin: Skin is warm and dry.    Vitals:   02/12/17 1100  BP: (!) 100/56  Pulse: 84  Temp: 98.3 F (36.8 C)  TempSrc: Oral  SpO2: 99%  Weight: 178 lb (80.7 kg)  Height: 4\' 11"  (1.499 m)      Assessment & Plan:

## 2017-02-14 NOTE — Assessment & Plan Note (Signed)
Suspect flare with some wheezing on exam. Likely viral and antibiotics are not needed. Rx for prednisone. Encouraged to continue advair and albuterol prn.

## 2017-02-18 ENCOUNTER — Ambulatory Visit: Payer: Medicare Other

## 2017-02-18 ENCOUNTER — Inpatient Hospital Stay
Admission: RE | Admit: 2017-02-18 | Discharge: 2017-02-18 | Disposition: A | Discharge status: Home / Self Care | Payer: Medicare Other | Source: Ambulatory Visit | Attending: Internal Medicine | Admitting: Internal Medicine

## 2017-04-08 ENCOUNTER — Encounter: Payer: Medicare Other | Admitting: Internal Medicine

## 2017-05-06 ENCOUNTER — Ambulatory Visit: Payer: Medicare Other | Admitting: Internal Medicine

## 2017-05-06 DIAGNOSIS — Z2089 Contact with and (suspected) exposure to other communicable diseases: Secondary | ICD-10-CM

## 2017-05-14 ENCOUNTER — Ambulatory Visit: Payer: Medicare Other | Admitting: Internal Medicine

## 2017-05-14 NOTE — Progress Notes (Deleted)
New Outpatient Visit Date: 05/14/2017  Primary Care Provider: Myrlene Broker, MD [address] [place], [place] [address]  Chief Complaint: Chest pain  HPI:  [name] is a 52 y.o. female who is being seen today for the evaluation of chest pain as a self referral. She has a history of HTN, HLD, diabetes mellitus, asthma, and depression/bipolar disorder. ***  --------------------------------------------------------------------------------------------------  Cardiovascular History & Procedures: Cardiovascular Problems:  Chest pain  Risk Factors:  HTN, hyperlipidemia, diabetes mellitus, and obesity  Cath/PCI:  None  CV Surgery:  None  EP Procedures and Devices:  None  Non-Invasive Evaluation(s):  None  Recent CV Pertinent Labs: Lab Results  Component Value Date   CHOL 148 02/04/2017   HDL 55.70 02/04/2017   LDLCALC 75 02/04/2017   TRIG 88.0 02/04/2017   CHOLHDL 3 02/04/2017   K 4.1 02/04/2017   BUN 11 02/04/2017   CREATININE 0.77 02/04/2017    --------------------------------------------------------------------------------------------------  Past Medical History:  Diagnosis Date  . Allergy    seasonal  . Asthma   . Depression   . Diabetes mellitus without complication (HCC)   . Hyperlipidemia   . Hypertension     Social History   Tobacco Use  . Smoking status: Never Smoker  . Smokeless tobacco: Never Used  Substance Use Topics  . Alcohol use: No    Alcohol/week: 0.0 oz  . Drug use: No    No outpatient medications have been marked as taking for the 05/14/17 encounter (Appointment) with Roper Tolson, Cristal Deer, MD.    Allergies: Penicillins and Penicillins  Social History   Socioeconomic History  . Marital status: Married    Spouse name: Not on file  . Number of children: Not on file  . Years of education: Not on file  . Highest education level: Not on file  Occupational History  . Not on file  Social Needs  . Financial  resource strain: Not on file  . Food insecurity:    Worry: Not on file    Inability: Not on file  . Transportation needs:    Medical: Not on file    Non-medical: Not on file  Tobacco Use  . Smoking status: Never Smoker  . Smokeless tobacco: Never Used  Substance and Sexual Activity  . Alcohol use: No    Alcohol/week: 0.0 oz  . Drug use: No  . Sexual activity: Not Currently  Lifestyle  . Physical activity:    Days per week: Not on file    Minutes per session: Not on file  . Stress: Not on file  Relationships  . Social connections:    Talks on phone: Not on file    Gets together: Not on file    Attends religious service: Not on file    Active member of club or organization: Not on file    Attends meetings of clubs or organizations: Not on file    Relationship status: Not on file  . Intimate partner violence:    Fear of current or ex partner: Not on file    Emotionally abused: Not on file    Physically abused: Not on file    Forced sexual activity: Not on file  Other Topics Concern  . Not on file  Social History Narrative   ** Merged History Encounter **        Family History  Problem Relation Age of Onset  . Hypertension Mother   . Diabetes Mother   . Dementia Mother   . Hypertension  Father   . Diabetes Father   . Diabetes Sister   . Angina Sister   . Cancer Brother   . Cancer Maternal Aunt     Review of Systems: A 12-system review of systems was performed and was negative except as noted in the HPI.  --------------------------------------------------------------------------------------------------  Physical Exam: There were no vitals taken for this visit.  General:  *** HEENT: No conjunctival pallor or scleral icterus. Moist mucous membranes. OP clear. Neck: Supple without lymphadenopathy, thyromegaly, JVD, or HJR. No carotid bruit. Lungs: Normal work of breathing. Clear to auscultation bilaterally without wheezes or crackles. Heart: Regular rate and  rhythm without murmurs, rubs, or gallops. Non-displaced PMI. Abd: Bowel sounds present. Soft, NT/ND without hepatosplenomegaly Ext: No lower extremity edema. Radial, PT, and DP pulses are 2+ bilaterally Skin: Warm and dry without rash. Neuro: CNIII-XII intact. Strength and fine-touch sensation intact in upper and lower extremities bilaterally. Psych: Normal mood and affect.  EKG:  ***  Lab Results  Component Value Date   WBC 7.6 02/04/2017   HGB 12.8 02/04/2017   HCT 39.0 02/04/2017   MCV 80.0 02/04/2017   PLT 296.0 02/04/2017    Lab Results  Component Value Date   NA 139 02/04/2017   K 4.1 02/04/2017   CL 102 02/04/2017   CO2 30 02/04/2017   BUN 11 02/04/2017   CREATININE 0.77 02/04/2017   GLUCOSE 139 (H) 02/04/2017   ALT 12 02/04/2017    Lab Results  Component Value Date   CHOL 148 02/04/2017   HDL 55.70 02/04/2017   LDLCALC 75 02/04/2017   TRIG 88.0 02/04/2017   CHOLHDL 3 02/04/2017     --------------------------------------------------------------------------------------------------  ASSESSMENT AND PLAN: Cristal Deer***  Dugan Vanhoesen, MD 05/14/2017 7:29 AM

## 2017-05-15 ENCOUNTER — Encounter: Payer: Self-pay | Admitting: Internal Medicine

## 2017-06-05 ENCOUNTER — Encounter: Payer: Self-pay | Admitting: Internal Medicine

## 2017-06-25 ENCOUNTER — Ambulatory Visit
Admission: RE | Admit: 2017-06-25 | Discharge: 2017-06-25 | Disposition: A | Discharge status: Home / Self Care | Payer: Medicare Other | Source: Ambulatory Visit | Attending: Internal Medicine | Admitting: Internal Medicine

## 2017-06-25 ENCOUNTER — Other Ambulatory Visit: Payer: Self-pay | Admitting: Family Medicine

## 2017-06-25 DIAGNOSIS — Z1231 Encounter for screening mammogram for malignant neoplasm of breast: Secondary | ICD-10-CM

## 2017-07-30 ENCOUNTER — Telehealth: Payer: Self-pay | Admitting: *Deleted

## 2017-07-30 NOTE — Telephone Encounter (Signed)
Unable to reach patient to reschedule visit. Colonoscopy cancelled and no show letter sent to patient

## 2017-07-30 NOTE — Telephone Encounter (Signed)
No show for previsit today. Phone call to patient to notify that previsit appointment needs to be rescheduled today before 5 pm in order to keep colonoscopy scheduled 08/13/17. If PV appointment not rescheduled by 5 pm today, colonoscopy scheduled 08/13/17 will be cancelled and both appointments will need to be rescheduled.

## 2017-08-12 ENCOUNTER — Ambulatory Visit: Payer: Medicare Other | Admitting: Cardiovascular Disease

## 2017-08-13 ENCOUNTER — Encounter: Payer: Medicare Other | Admitting: Internal Medicine

## 2017-08-14 ENCOUNTER — Encounter: Payer: Self-pay | Admitting: Cardiovascular Disease

## 2018-04-17 ENCOUNTER — Ambulatory Visit (INDEPENDENT_AMBULATORY_CARE_PROVIDER_SITE_OTHER): Payer: PRIVATE HEALTH INSURANCE | Admitting: Internal Medicine

## 2018-04-17 ENCOUNTER — Encounter: Payer: Self-pay | Admitting: Internal Medicine

## 2018-04-17 DIAGNOSIS — J4531 Mild persistent asthma with (acute) exacerbation: Secondary | ICD-10-CM

## 2018-04-17 MED ORDER — PREDNISONE 20 MG PO TABS: 40.0000 mg | ORAL_TABLET | Freq: Every day | ORAL | 0 refills | Status: DC

## 2018-04-17 NOTE — Progress Notes (Signed)
Virtual Visit via Video Note  I connected with Destiny Trujillo on 04/17/18 at  3:00 PM EDT by a video enabled telemedicine application and verified that I am speaking with the correct person using two identifiers.   I discussed the limitations of evaluation and management by telemedicine and the availability of in person appointments. The patient expressed understanding and agreed to proceed.  History of Present Illness: The patient is a 53 y.o. YO female with visit for asthma flare. Started 2-3 days ago. Normally spring is her troublesome pollen time. She is taking advair, singulair, claritin, flonase. Needing albuterol treatment 1-2 times per day. Breathing is worse at night. No night time awakenings. Some wheezing and SOB around bedtime, does nebulizer and then goes to sleep. Okay with walking. Still working although did not go today. Denies fevers or chills. Denies cough. Does have sinus congestion and sore throat. Denies nausea. Overall it is stable since onset. Has tried increasing albuterol nebulizers with some limited success.  Observations/Objective: Appearance: normal, breathing appears normal, normal grooming, abdomen does not appear distended, throat mild drainage and red, mental status is A and O times 3  Assessment and Plan: See problem oriented charting  Follow Up Instructions: rx for prednisone, work note to excuse for today  I discussed the assessment and treatment plan with the patient. The patient was provided an opportunity to ask questions and all were answered. The patient agreed with the plan and demonstrated an understanding of the instructions.   The patient was advised to call back or seek an in-person evaluation if the symptoms worsen or if the condition fails to improve as anticipated.  I provided 15 minutes of non-face-to-face time during this encounter.  Myrlene Broker, MD

## 2018-04-17 NOTE — Assessment & Plan Note (Signed)
With flare today, does not sound typical for coronavirus. Rx for prednisone. Advised to take advair regularly, singulair and claritin. Continue albuterol as needed. Return for visit post-flare for routine care and adjustment of regimen if needed.

## 2018-04-17 NOTE — Progress Notes (Signed)
Appointment has been made

## 2018-04-22 ENCOUNTER — Other Ambulatory Visit: Payer: Self-pay | Admitting: *Deleted

## 2018-04-22 MED ORDER — FREESTYLE PRECISION NEO SYSTEM W/DEVICE KIT: 1.0000 | PACK | Freq: Two times a day (BID) | 0 refills | Status: DC | PRN

## 2018-04-22 MED ORDER — GLUCOSE BLOOD VI STRP: 1.0000 | ORAL_STRIP | Freq: Two times a day (BID) | 1 refills | Status: DC

## 2018-04-22 MED ORDER — LISINOPRIL-HYDROCHLOROTHIAZIDE 20-12.5 MG PO TABS: 1.0000 | ORAL_TABLET | Freq: Every day | ORAL | 0 refills | Status: DC

## 2018-05-11 ENCOUNTER — Encounter: Payer: Self-pay | Admitting: Internal Medicine

## 2018-05-11 ENCOUNTER — Ambulatory Visit (INDEPENDENT_AMBULATORY_CARE_PROVIDER_SITE_OTHER): Payer: PRIVATE HEALTH INSURANCE | Admitting: Internal Medicine

## 2018-05-11 DIAGNOSIS — J4531 Mild persistent asthma with (acute) exacerbation: Secondary | ICD-10-CM | POA: Diagnosis not present

## 2018-05-11 DIAGNOSIS — G4700 Insomnia, unspecified: Secondary | ICD-10-CM | POA: Insufficient documentation

## 2018-05-11 DIAGNOSIS — E118 Type 2 diabetes mellitus with unspecified complications: Secondary | ICD-10-CM

## 2018-05-11 DIAGNOSIS — F19982 Other psychoactive substance use, unspecified with psychoactive substance-induced sleep disorder: Secondary | ICD-10-CM | POA: Diagnosis not present

## 2018-05-11 MED ORDER — PREDNISONE 20 MG PO TABS: 40.0000 mg | ORAL_TABLET | Freq: Every day | ORAL | 0 refills | Status: DC

## 2018-05-11 NOTE — Assessment & Plan Note (Signed)
She is aware that she needs visit for labs and routine care and we talked about the risk of increasing sugars with prednisone therapy.

## 2018-05-11 NOTE — Assessment & Plan Note (Signed)
Flare today and she is now taking advair appropriately BID. Asked to continue this even if prednisone helps. She is using albuterol prn and asked to cut down unless SOB as this could be impacting her sleep. Continue flonase, singulair and claritin.

## 2018-05-11 NOTE — Progress Notes (Signed)
Virtual Visit via Video Note  I connected with Destiny Trujillo on 05/11/18 at  1:40 PM EDT by a video enabled telemedicine application and verified that I am speaking with the correct person using two identifiers.   I discussed the limitations of evaluation and management by telemedicine and the availability of in person appointments. The patient expressed understanding and agreed to proceed.  History of Present Illness: The patient is a 53 y.o. female with visit for asthma. It is noticed that she has been seeing [facility] for her primary care and has seen them and [place]. We have discussed with her that she should only have 1 primary care provider in order to not receive duplicative care and she has decided to see [place] and agrees to only see 1 primary care provider. She saw [place] about 1 month ago with asthma flare and SOB. She was given 5 days prednisone which she states helped well. She then started having some SOB symptoms again about 1-2 weeks later. She used to take advair once a day only, she has now been taking twice a day. She is also using albuterol inhaler 3-4 times per day and nebulizer duoneb treatment in the evening. She denies much SOB but has some wheezing she can hear. She denies sinus drainage or pressure. No fevers or chills. No cough. Denies chest pains. Also having insomnia and someone prescribed her trazodone 50 mg tablet. She took this and it made her drowsy in the morning when she woke up. She did not take this again. Wanted to know if this could be adjusted or changed. Denies SOB at night.  PMH, FMH, social history reviewed and updated  Observations/Objective: Appearance: well appearing, breathing appears normal, casual grooming, abdomen does not appear distended, throat normal, mental status is A and O times 3  Assessment and Plan: See problem oriented charting  Follow Up Instructions: Rx prednisone 1 week and if no improvement or recurrence needs adjustment of her asthma  regimen.   I discussed the assessment and treatment plan with the patient. The patient was provided an opportunity to ask questions and all were answered. The patient agreed with the plan and demonstrated an understanding of the instructions.   The patient was advised to call back or seek an in-person evaluation if the symptoms worsen or if the condition fails to improve as anticipated.  Myrlene Broker, MD   Visit time 25 minutes: greater than 50% of that time was spent in face to face counseling and coordination of care with the patient: counseled about asthma, treatment options, allergies, as well as the relation to her diabetes and insomnia related to her increased albuterol usage.

## 2018-05-11 NOTE — Assessment & Plan Note (Signed)
Likely related to increased albuterol usage and we discussed. She will try 1/2 pill (25 mg) trazodone at night time to see if this helps without making her feel drowsy in the morning.

## 2018-06-16 ENCOUNTER — Telehealth: Payer: Self-pay

## 2018-06-16 NOTE — Telephone Encounter (Signed)
LVM for patient to reschedule tomorrows physical appointment as Dr. Okey Dupre will not be in office

## 2018-06-17 ENCOUNTER — Encounter: Payer: PRIVATE HEALTH INSURANCE | Admitting: Internal Medicine

## 2018-12-10 ENCOUNTER — Encounter: Payer: Self-pay | Admitting: Internal Medicine

## 2018-12-10 ENCOUNTER — Other Ambulatory Visit (INDEPENDENT_AMBULATORY_CARE_PROVIDER_SITE_OTHER): Payer: Managed Care, Other (non HMO)

## 2018-12-10 ENCOUNTER — Ambulatory Visit (INDEPENDENT_AMBULATORY_CARE_PROVIDER_SITE_OTHER): Payer: Managed Care, Other (non HMO) | Admitting: Internal Medicine

## 2018-12-10 ENCOUNTER — Other Ambulatory Visit: Payer: Self-pay

## 2018-12-10 VITALS — BP 122/80 | HR 78 | Temp 98.2°F | Ht 59.0 in | Wt 180.0 lb

## 2018-12-10 DIAGNOSIS — E1169 Type 2 diabetes mellitus with other specified complication: Secondary | ICD-10-CM

## 2018-12-10 DIAGNOSIS — E118 Type 2 diabetes mellitus with unspecified complications: Secondary | ICD-10-CM | POA: Diagnosis not present

## 2018-12-10 DIAGNOSIS — I10 Essential (primary) hypertension: Secondary | ICD-10-CM

## 2018-12-10 DIAGNOSIS — Z0000 Encounter for general adult medical examination without abnormal findings: Secondary | ICD-10-CM

## 2018-12-10 DIAGNOSIS — J4530 Mild persistent asthma, uncomplicated: Secondary | ICD-10-CM

## 2018-12-10 DIAGNOSIS — E785 Hyperlipidemia, unspecified: Secondary | ICD-10-CM

## 2018-12-10 LAB — LIPID PANEL
Cholesterol: 183 mg/dL (ref 0–200)
HDL: 49.2 mg/dL (ref >39.00)
LDL Cholesterol: 113 mg/dL — ABNORMAL HIGH (ref 0–99)
NonHDL: 133.6
Total CHOL/HDL Ratio: 4
Triglycerides: 103 mg/dL (ref 0.0–149.0)
VLDL: 20.6 mg/dL (ref 0.0–40.0)

## 2018-12-10 LAB — CBC
HCT: 37.6 % (ref 36.0–46.0)
Hemoglobin: 12.6 g/dL (ref 12.0–15.0)
MCHC: 33.5 g/dL (ref 30.0–36.0)
MCV: 79 fl (ref 78.0–100.0)
Platelets: 326 10*3/uL (ref 150.0–400.0)
RBC: 4.75 Mil/uL (ref 3.87–5.11)
RDW: 14.2 % (ref 11.5–15.5)
WBC: 8.6 10*3/uL (ref 4.0–10.5)

## 2018-12-10 LAB — COMPREHENSIVE METABOLIC PANEL
ALT: 13 U/L (ref 0–35)
AST: 12 U/L (ref 0–37)
Albumin: 4.4 g/dL (ref 3.5–5.2)
Alkaline Phosphatase: 91 U/L (ref 39–117)
BUN: 14 mg/dL (ref 6–23)
CO2: 27 mEq/L (ref 19–32)
Calcium: 9.8 mg/dL (ref 8.4–10.5)
Chloride: 101 mEq/L (ref 96–112)
Creatinine, Ser: 0.78 mg/dL (ref 0.40–1.20)
GFR: 93.02 mL/min (ref >60.00)
Glucose, Bld: 129 mg/dL — ABNORMAL HIGH (ref 70–99)
Potassium: 3.9 mEq/L (ref 3.5–5.1)
Sodium: 136 mEq/L (ref 135–145)
Total Bilirubin: 0.3 mg/dL (ref 0.2–1.2)
Total Protein: 7.8 g/dL (ref 6.0–8.3)

## 2018-12-10 LAB — HEMOGLOBIN A1C: Hgb A1c MFr Bld: 7.1 % — ABNORMAL HIGH (ref 4.6–6.5)

## 2018-12-10 LAB — MICROALBUMIN / CREATININE URINE RATIO
Creatinine,U: 94.6 mg/dL
Microalb Creat Ratio: 0.7 mg/g (ref 0.0–30.0)
Microalb, Ur: <0.7 mg/dL (ref 0.0–1.9)

## 2018-12-10 MED ORDER — SIMVASTATIN 20 MG PO TABS: 20.0000 mg | ORAL_TABLET | Freq: Every day | ORAL | 3 refills | Status: DC

## 2018-12-10 MED ORDER — INSULIN GLARGINE 100 UNIT/ML SOLOSTAR PEN: 40.0000 [IU] | PEN_INJECTOR | Freq: Every day | SUBCUTANEOUS | 11 refills | Status: DC

## 2018-12-10 MED ORDER — METFORMIN HCL 1000 MG PO TABS: ORAL_TABLET | ORAL | 3 refills | Status: DC

## 2018-12-10 MED ORDER — FLUTICASONE-SALMETEROL 500-50 MCG/DOSE IN AEPB: 1.0000 | INHALATION_SPRAY | Freq: Two times a day (BID) | RESPIRATORY_TRACT | 3 refills | Status: DC

## 2018-12-10 MED ORDER — FLUTICASONE PROPIONATE 50 MCG/ACT NA SUSP: 2.0000 | Freq: Every day | NASAL | 6 refills | Status: DC

## 2018-12-10 MED ORDER — LISINOPRIL-HYDROCHLOROTHIAZIDE 20-12.5 MG PO TABS: 1.0000 | ORAL_TABLET | Freq: Every day | ORAL | 3 refills | Status: DC

## 2018-12-10 MED ORDER — ALBUTEROL SULFATE HFA 108 (90 BASE) MCG/ACT IN AERS: 1.0000 | INHALATION_SPRAY | Freq: Four times a day (QID) | RESPIRATORY_TRACT | 1 refills | Status: DC | PRN

## 2018-12-10 NOTE — Assessment & Plan Note (Signed)
Flu shot up to date. Pneumonia up to date. Shingrix counseled. Tetanus up to date. Colonoscopy declines for now. Mammogram up to date, pap smear up to date. Counseled about sun safety and mole surveillance. Counseled about the dangers of distracted driving. Given 10 year screening recommendations.

## 2018-12-10 NOTE — Assessment & Plan Note (Signed)
BMI 36 but complicated by diabetes, hypertension, hyperlipidemia.  

## 2018-12-10 NOTE — Assessment & Plan Note (Signed)
Stable with advair and albuterol prn. Refilled both today.

## 2018-12-10 NOTE — Patient Instructions (Signed)
Health Maintenance, Female Adopting a healthy lifestyle and getting preventive care are important in promoting health and wellness. Ask your health care provider about:  The right schedule for you to have regular tests and exams.  Things you can do on your own to prevent diseases and keep yourself healthy. What should I know about diet, weight, and exercise? Eat a healthy diet   Eat a diet that includes plenty of vegetables, fruits, low-fat dairy products, and lean protein.  Do not eat a lot of foods that are high in solid fats, added sugars, or sodium. Maintain a healthy weight Body mass index (BMI) is used to identify weight problems. It estimates body fat based on height and weight. Your health care provider can help determine your BMI and help you achieve or maintain a healthy weight. Get regular exercise Get regular exercise. This is one of the most important things you can do for your health. Most adults should:  Exercise for at least 150 minutes each week. The exercise should increase your heart rate and make you sweat (moderate-intensity exercise).  Do strengthening exercises at least twice a week. This is in addition to the moderate-intensity exercise.  Spend less time sitting. Even light physical activity can be beneficial. Watch cholesterol and blood lipids Have your blood tested for lipids and cholesterol at 54 years of age, then have this test every 5 years. Have your cholesterol levels checked more often if:  Your lipid or cholesterol levels are high.  You are older than 54 years of age.  You are at high risk for heart disease. What should I know about cancer screening? Depending on your health history and family history, you may need to have cancer screening at various ages. This may include screening for:  Breast cancer.  Cervical cancer.  Colorectal cancer.  Skin cancer.  Lung cancer. What should I know about heart disease, diabetes, and high blood  pressure? Blood pressure and heart disease  High blood pressure causes heart disease and increases the risk of stroke. This is more likely to develop in people who have high blood pressure readings, are of African descent, or are overweight.  Have your blood pressure checked: ? Every 3-5 years if you are 18-39 years of age. ? Every year if you are 40 years old or older. Diabetes Have regular diabetes screenings. This checks your fasting blood sugar level. Have the screening done:  Once every three years after age 40 if you are at a normal weight and have a low risk for diabetes.  More often and at a younger age if you are overweight or have a high risk for diabetes. What should I know about preventing infection? Hepatitis B If you have a higher risk for hepatitis B, you should be screened for this virus. Talk with your health care provider to find out if you are at risk for hepatitis B infection. Hepatitis C Testing is recommended for:  Everyone born from 1945 through 1965.  Anyone with known risk factors for hepatitis C. Sexually transmitted infections (STIs)  Get screened for STIs, including gonorrhea and chlamydia, if: ? You are sexually active and are younger than 54 years of age. ? You are older than 54 years of age and your health care provider tells you that you are at risk for this type of infection. ? Your sexual activity has changed since you were last screened, and you are at increased risk for chlamydia or gonorrhea. Ask your health care provider if   you are at risk.  Ask your health care provider about whether you are at high risk for HIV. Your health care provider may recommend a prescription medicine to help prevent HIV infection. If you choose to take medicine to prevent HIV, you should first get tested for HIV. You should then be tested every 3 months for as long as you are taking the medicine. Pregnancy  If you are about to stop having your period (premenopausal) and  you may become pregnant, seek counseling before you get pregnant.  Take 400 to 800 micrograms (mcg) of folic acid every day if you become pregnant.  Ask for birth control (contraception) if you want to prevent pregnancy. Osteoporosis and menopause Osteoporosis is a disease in which the bones lose minerals and strength with aging. This can result in bone fractures. If you are 65 years old or older, or if you are at risk for osteoporosis and fractures, ask your health care provider if you should:  Be screened for bone loss.  Take a calcium or vitamin D supplement to lower your risk of fractures.  Be given hormone replacement therapy (HRT) to treat symptoms of menopause. Follow these instructions at home: Lifestyle  Do not use any products that contain nicotine or tobacco, such as cigarettes, e-cigarettes, and chewing tobacco. If you need help quitting, ask your health care provider.  Do not use street drugs.  Do not share needles.  Ask your health care provider for help if you need support or information about quitting drugs. Alcohol use  Do not drink alcohol if: ? Your health care provider tells you not to drink. ? You are pregnant, may be pregnant, or are planning to become pregnant.  If you drink alcohol: ? Limit how much you use to 0-1 drink a day. ? Limit intake if you are breastfeeding.  Be aware of how much alcohol is in your drink. In the [place] one drink equals one 12 oz bottle of beer (355 mL), one 5 oz glass of wine (148 mL), or one 1 oz glass of hard liquor (44 mL). General instructions  Schedule regular health, dental, and eye exams.  Stay current with your vaccines.  Tell your health care provider if: ? You often feel depressed. ? You have ever been abused or do not feel safe at home. Summary  Adopting a healthy lifestyle and getting preventive care are important in promoting health and wellness.  Follow your health care provider's instructions about healthy  diet, exercising, and getting tested or screened for diseases.  Follow your health care provider's instructions on monitoring your cholesterol and blood pressure. This information is not intended to replace advice given to you by your health care provider. Make sure you discuss any questions you have with your health care provider. Document Released: 07/23/2010 Document Revised: 12/31/2017 Document Reviewed: 12/31/2017 Elsevier Patient Education  2020 [employer]  

## 2018-12-10 NOTE — Assessment & Plan Note (Signed)
BP at goal on lisinopril/hctz 20/12.5 mg daily. Room to increase if needed. Checking CMP and adjust as needed.

## 2018-12-10 NOTE — Progress Notes (Signed)
   Subjective:   Patient ID: Destiny Trujillo, female    DOB: 03/01/1965, 53 y.o.   MRN: [id number]  HPI The patient is a 53 YO female coming in for physical.   PMH, [place], social history reviewed and updated  Review of Systems  Constitutional: Negative.   HENT: Negative.   Eyes: Negative.   Respiratory: Negative for cough, chest tightness and shortness of breath.   Cardiovascular: Negative for chest pain, palpitations and leg swelling.  Gastrointestinal: Negative for abdominal distention, abdominal pain, constipation, diarrhea, nausea and vomiting.  Musculoskeletal: Negative.   Skin: Negative.   Neurological: Negative.   Psychiatric/Behavioral: Negative.     Objective:  Physical Exam Constitutional:      Appearance: She is well-developed. She is obese.  HENT:     Head: Normocephalic and atraumatic.  Neck:     Musculoskeletal: Normal range of motion.  Cardiovascular:     Rate and Rhythm: Normal rate and regular rhythm.  Pulmonary:     Effort: Pulmonary effort is normal. No respiratory distress.     Breath sounds: Normal breath sounds. No wheezing or rales.  Abdominal:     General: Bowel sounds are normal. There is no distension.     Palpations: Abdomen is soft.     Tenderness: There is no abdominal tenderness. There is no rebound.  Skin:    General: Skin is warm and dry.     Comments: Foot exam done  Neurological:     Mental Status: She is alert and oriented to person, place, and time.     Coordination: Coordination normal.     Vitals:   12/10/18 0930  BP: 122/80  Pulse: 78  Temp: 98.2 F (36.8 C)  TempSrc: Oral  SpO2: 97%  Weight: 180 lb (81.6 kg)  Height: 4\' 11"  (1.499 m)    Assessment & Plan:

## 2018-12-10 NOTE — Assessment & Plan Note (Signed)
Checking HgA1c, foot exam done. Checking microalbumin to creatinine ratio. Adjust lantus and metformin as needed. Denies low sugars.

## 2018-12-10 NOTE — Assessment & Plan Note (Signed)
Taking simvastatin. Checking lipid panel and adjust as needed.  

## 2018-12-14 ENCOUNTER — Encounter: Payer: Self-pay | Admitting: Internal Medicine

## 2018-12-14 MED ORDER — SIMVASTATIN 40 MG PO TABS: 40.0000 mg | ORAL_TABLET | Freq: Every day | ORAL | 3 refills | Status: DC

## 2019-01-11 ENCOUNTER — Encounter: Payer: Self-pay | Admitting: Internal Medicine

## 2019-01-11 ENCOUNTER — Ambulatory Visit (INDEPENDENT_AMBULATORY_CARE_PROVIDER_SITE_OTHER): Payer: Managed Care, Other (non HMO) | Admitting: Internal Medicine

## 2019-01-11 DIAGNOSIS — J4530 Mild persistent asthma, uncomplicated: Secondary | ICD-10-CM

## 2019-01-11 MED ORDER — MONTELUKAST SODIUM 10 MG PO TABS: 10.0000 mg | ORAL_TABLET | Freq: Every day | ORAL | 3 refills | Status: DC

## 2019-01-11 MED ORDER — BENZONATATE 200 MG PO CAPS: 200.0000 mg | ORAL_CAPSULE | Freq: Three times a day (TID) | ORAL | 0 refills | Status: DC | PRN

## 2019-01-11 NOTE — Progress Notes (Signed)
Virtual Visit via Video Note  I connected with Destiny Trujillo on 01/11/19 at  4:00 PM EST by a video enabled telemedicine application and verified that I am speaking with the correct person using two identifiers.  The patient and the provider were at separate locations throughout the entire encounter.   I discussed the limitations of evaluation and management by telemedicine and the availability of in person appointments. The patient expressed understanding and agreed to proceed. The patient and the provider were the only parties present for the visit unless noted in HPI below.  History of Present Illness: The patient is a 53 y.o. female with visit for cough and itchy throat. Started about 1 week ago. Has no fevers or chills. Denies worsening SOB. Denies diarrhea, nausea, abdominal pain. Denies headaches. Is using advair for her asthma but only daily. Is out of singulair for some time. Using albuterol and duoneb when needed about same as usual. Overall it is not improving but not worsening. Denies known exposure to covid-19. Has tried nothing but her normal medications.  Observations/Objective: Appearance: normal, coughing during visit, breathing appears normal, casual grooming, abdomen does not appear distended, throat normal, memory normal, mental status is A and O times 3  Assessment and Plan: See problem oriented charting  Follow Up Instructions: increase advair to BID, start singulair, advised covid-19 testing which she declines, asked her to then quarantine until symptoms resolve since she will not get tested  I discussed the assessment and treatment plan with the patient. The patient was provided an opportunity to ask questions and all were answered. The patient agreed with the plan and demonstrated an understanding of the instructions.   The patient was advised to call back or seek an in-person evaluation if the symptoms worsen or if the condition fails to improve as  anticipated.  Hoyt Koch, MD

## 2019-01-12 NOTE — Assessment & Plan Note (Signed)
Without clear flare. Could have covid-19 but declines testing. Advised to increase advair to BID and start singulair. Keep allergy medications. Rx tessalon perles. If no improvement or worsening let us know. Advised to quarantine until symptoms resolve since she will not get tested for covid-19.

## 2019-01-13 ENCOUNTER — Encounter: Payer: Self-pay | Admitting: Internal Medicine

## 2019-01-13 ENCOUNTER — Emergency Department
Admission: EM | Admit: 2019-01-13 | Discharge: 2019-01-14 | Disposition: A | Discharge status: Home / Self Care | Payer: Managed Care, Other (non HMO) | Attending: Emergency Medicine | Admitting: Emergency Medicine

## 2019-01-13 ENCOUNTER — Other Ambulatory Visit: Payer: Self-pay

## 2019-01-13 ENCOUNTER — Encounter: Payer: Self-pay | Admitting: Emergency Medicine

## 2019-01-13 DIAGNOSIS — Z7982 Long term (current) use of aspirin: Secondary | ICD-10-CM | POA: Insufficient documentation

## 2019-01-13 DIAGNOSIS — N390 Urinary tract infection, site not specified: Secondary | ICD-10-CM | POA: Diagnosis not present

## 2019-01-13 DIAGNOSIS — R1084 Generalized abdominal pain: Secondary | ICD-10-CM | POA: Diagnosis not present

## 2019-01-13 DIAGNOSIS — E119 Type 2 diabetes mellitus without complications: Secondary | ICD-10-CM | POA: Insufficient documentation

## 2019-01-13 DIAGNOSIS — J45909 Unspecified asthma, uncomplicated: Secondary | ICD-10-CM | POA: Diagnosis not present

## 2019-01-13 DIAGNOSIS — Z794 Long term (current) use of insulin: Secondary | ICD-10-CM | POA: Diagnosis not present

## 2019-01-13 DIAGNOSIS — I10 Essential (primary) hypertension: Secondary | ICD-10-CM | POA: Diagnosis not present

## 2019-01-13 DIAGNOSIS — Z79899 Other long term (current) drug therapy: Secondary | ICD-10-CM | POA: Diagnosis not present

## 2019-01-13 LAB — URINALYSIS, COMPLETE (UACMP) WITH MICROSCOPIC
Bacteria, UA: NONE SEEN
Bilirubin Urine: NEGATIVE
Glucose, UA: NEGATIVE mg/dL
Hgb urine dipstick: NEGATIVE
Ketones, ur: 20 mg/dL — AB
Nitrite: NEGATIVE
Protein, ur: NEGATIVE mg/dL
Specific Gravity, Urine: 1.014 (ref 1.005–1.030)
pH: 5 (ref 5.0–8.0)

## 2019-01-13 LAB — COMPREHENSIVE METABOLIC PANEL
ALT: 20 U/L (ref 0–44)
AST: 16 U/L (ref 15–41)
Albumin: 4.3 g/dL (ref 3.5–5.0)
Alkaline Phosphatase: 70 U/L (ref 38–126)
Anion gap: 13 (ref 5–15)
BUN: 12 mg/dL (ref 6–20)
CO2: 26 mmol/L (ref 22–32)
Calcium: 9.5 mg/dL (ref 8.9–10.3)
Chloride: 98 mmol/L (ref 98–111)
Creatinine, Ser: 0.77 mg/dL (ref 0.44–1.00)
GFR calc Af Amer: >60 mL/min (ref >60)
GFR calc non Af Amer: >60 mL/min (ref >60)
Glucose, Bld: 151 mg/dL — ABNORMAL HIGH (ref 70–99)
Potassium: 3.9 mmol/L (ref 3.5–5.1)
Sodium: 137 mmol/L (ref 135–145)
Total Bilirubin: 0.4 mg/dL (ref 0.3–1.2)
Total Protein: 8.4 g/dL — ABNORMAL HIGH (ref 6.5–8.1)

## 2019-01-13 LAB — CBC
HCT: 38.9 % (ref 36.0–46.0)
Hemoglobin: 13.2 g/dL (ref 12.0–15.0)
MCH: 26.2 pg (ref 26.0–34.0)
MCHC: 33.9 g/dL (ref 30.0–36.0)
MCV: 77.2 fL — ABNORMAL LOW (ref 80.0–100.0)
Platelets: 320 10*3/uL (ref 150–400)
RBC: 5.04 MIL/uL (ref 3.87–5.11)
RDW: 14 % (ref 11.5–15.5)
WBC: 7.8 10*3/uL (ref 4.0–10.5)
nRBC: 0 % (ref 0.0–0.2)

## 2019-01-13 LAB — LIPASE, BLOOD: Lipase: 23 U/L (ref 11–51)

## 2019-01-13 NOTE — ED Provider Notes (Signed)
[facility] Emergency Department Provider Note   ____________________________________________   First MD Initiated Contact with Patient 01/13/19 2309     (approximate)  I have reviewed the triage vital signs and the nursing notes.   HISTORY  Chief Complaint Abdominal Pain    HPI Destiny Trujillo is a 53 y.o. female who presents to the ED from home with a chief complaint of abdominal pain.  Starting Monday patient developed chills and cough.  Started taking some expired clindamycin which then caused her to have diarrhea.  Had been having cold symptoms for approximately 10 days.  Had a telehealth visit with her PCP several days ago who recommended COVID-19 visit but patient declines.  Denies associated nausea, vomiting.  Complains of generalized abdominal pain.       Past Medical History:  Diagnosis Date  . Allergy    seasonal  . Asthma   . Depression   . Diabetes mellitus without complication (HCC)   . Hyperlipidemia   . Hypertension     Patient Active Problem List   Diagnosis Date Noted  . Routine general medical examination at a health care facility 12/10/2018  . Insomnia 05/11/2018  . Type 2 diabetes with complication (HCC) 03/18/2015  . Essential hypertension 03/18/2015  . Hyperlipidemia associated with type 2 diabetes mellitus (HCC) 03/18/2015  . Asthma 03/18/2015  . Morbid obesity (HCC) 03/18/2015    History reviewed. No pertinent surgical history.  Prior to Admission medications   Medication Sig Start Date End Date Taking? Authorizing Provider  albuterol (VENTOLIN HFA) 108 (90 Base) MCG/ACT inhaler Inhale 1 puff into the lungs every 6 (six) hours as needed for wheezing or shortness of breath. 12/10/18   Myrlene Brokerrawford, Elizabeth A, MD  aspirin 81 MG tablet Take 81 mg by mouth daily.    [provider]  benzonatate (TESSALON) 200 MG capsule Take 1 capsule (200 mg total) by mouth 3 (three) times daily as needed. 01/11/19    Myrlene Brokerrawford, Elizabeth A, MD  diphenhydramine-acetaminophen (TYLENOL PM) 25-500 MG TABS tablet Take 1 tablet by mouth at bedtime as needed (for pain/sleep). Reported on 07/07/2015    [provider]  fluticasone (FLONASE) 50 MCG/ACT nasal spray Place 2 sprays into both nostrils daily. 12/10/18   Myrlene Brokerrawford, Elizabeth A, MD  Fluticasone-Salmeterol (ADVAIR) 500-50 MCG/DOSE AEPB Inhale 1 puff into the lungs 2 (two) times daily. 12/10/18   Myrlene Brokerrawford, Elizabeth A, MD  Insulin Glargine (LANTUS) 100 UNIT/ML Solostar Pen Inject 40 Units into the skin daily at 8 pm. 12/10/18   Myrlene Brokerrawford, Elizabeth A, MD  ipratropium-albuterol (DUONEB) 0.5-2.5 (3) MG/3ML SOLN Take 3 mLs by nebulization every 6 (six) hours as needed. 06/01/15   Myrlene Brokerrawford, Elizabeth A, MD  lisinopril-hydrochlorothiazide (ZESTORETIC) 20-12.5 MG tablet Take 1 tablet by mouth daily. 12/10/18   Myrlene Brokerrawford, Elizabeth A, MD  loratadine (CLARITIN) 10 MG tablet Take 1 tablet (10 mg total) by mouth daily. 02/12/17   Myrlene Brokerrawford, Elizabeth A, MD  metFORMIN (GLUCOPHAGE) 1000 MG tablet TAKE 1 TABLET (1,000 MG TOTAL) BY MOUTH 2 (TWO) TIMES A DAY WITH MEALS. 12/10/18   Myrlene Brokerrawford, Elizabeth A, MD  montelukast (SINGULAIR) 10 MG tablet Take 1 tablet (10 mg total) by mouth at bedtime. 01/11/19   Myrlene Brokerrawford, Elizabeth A, MD  Multiple Vitamin (MULTIVITAMIN) capsule Take 1 capsule by mouth daily.    [provider]  nitrofurantoin, macrocrystal-monohydrate, (MACROBID) 100 MG capsule Take 1 capsule (100 mg total) by mouth 2 (two) times daily. 01/14/19   Irean HongSung, Evelena Masci J, MD  ondansetron (ZOFRAN ODT) 4 MG disintegrating tablet Take 1 tablet (4 mg total) by mouth every 8 (eight) hours as needed for nausea or vomiting. 01/14/19   Irean Hong, MD  simvastatin (ZOCOR) 40 MG tablet Take 1 tablet (40 mg total) by mouth daily. 12/14/18   Myrlene Broker, MD    Allergies Penicillins and Penicillins  Family History  Problem Relation Age of Onset  . Hypertension Mother     . Diabetes Mother   . Dementia Mother   . Hypertension Father   . Diabetes Father   . Diabetes Sister   . Angina Sister   . Cancer Brother   . Cancer Maternal Aunt     Social History Social History   Tobacco Use  . Smoking status: Never Smoker  . Smokeless tobacco: Never Used  Substance Use Topics  . Alcohol use: No    Alcohol/week: 0.0 standard drinks  . Drug use: No    Review of Systems  Constitutional: No fever/chills Eyes: No visual changes. ENT: No sore throat. Cardiovascular: Denies chest pain. Respiratory: Positive for cough.  Denies shortness of breath. Gastrointestinal: Positive for abdominal pain.  No nausea, no vomiting.  No diarrhea.  No constipation. Genitourinary: Negative for dysuria. Musculoskeletal: Negative for back pain. Skin: Negative for rash. Neurological: Negative for headaches, focal weakness or numbness.   ____________________________________________   PHYSICAL EXAM:  VITAL SIGNS: ED Triage Vitals  Enc Vitals Group     BP 01/13/19 1756 140/87     Pulse Rate 01/13/19 1756 80     Resp 01/13/19 1756 16     Temp 01/13/19 1756 99.2 F (37.3 C)     Temp Source 01/13/19 1756 Oral     SpO2 01/13/19 1756 98 %     Weight 01/13/19 1757 171 lb (77.6 kg)     Height 01/13/19 1757 4\' 11"  (1.499 m)     Head Circumference --      Peak Flow --      Pain Score 01/13/19 1757 9     Pain Loc --      Pain Edu? --      Excl. in GC? --     Constitutional: Alert and oriented. Well appearing and in no acute distress. Eyes: Conjunctivae are normal. PERRL. EOMI. Head: Atraumatic. Nose: No congestion/rhinnorhea. Mouth/Throat: Mucous membranes are moist.  Oropharynx non-erythematous. Neck: No stridor.  Supple neck without meningismus. Cardiovascular: Normal rate, regular rhythm. Grossly normal heart sounds.  Good peripheral circulation. Respiratory: Normal respiratory effort.  No retractions. Lungs CTAB. Gastrointestinal: Soft with mild diffuse  tenderness to palpation without rebound or guarding. No distention. No abdominal bruits. No CVA tenderness. Musculoskeletal: No lower extremity tenderness nor edema.  No joint effusions. Neurologic:  Normal speech and language. No gross focal neurologic deficits are appreciated. No gait instability. Skin:  Skin is warm, dry and intact. No rash noted.  No petechiae. Psychiatric: Mood and affect are normal. Speech and behavior are normal.  ____________________________________________   LABS (all labs ordered are listed, but only abnormal results are displayed)  Labs Reviewed  COMPREHENSIVE METABOLIC PANEL - Abnormal; Notable for the following components:      Result Value   Glucose, Bld 151 (*)    Total Protein 8.4 (*)    All other components within normal limits  CBC - Abnormal; Notable for the following components:   MCV 77.2 (*)    All other components within normal limits  URINALYSIS, COMPLETE (UACMP) WITH MICROSCOPIC - Abnormal; Notable  for the following components:   Color, Urine YELLOW (*)    APPearance HAZY (*)    Ketones, ur 20 (*)    Leukocytes,Ua MODERATE (*)    All other components within normal limits  URINE CULTURE  LIPASE, BLOOD   ____________________________________________  EKG  ED ECG REPORT I, Mong Neal J, the attending physician, personally viewed and interpreted this ECG.   Date: 01/14/2019  EKG Time: 1801  Rate: 82  Rhythm: normal EKG, normal sinus rhythm  Axis: Normal  Intervals:none  ST&T Change: Nonspecific  ____________________________________________  RADIOLOGY  ED MD interpretation: None  Official radiology report(s): No results found.  ____________________________________________   PROCEDURES  Procedure(s) performed (including Critical Care):  Procedures   ____________________________________________   INITIAL IMPRESSION / ASSESSMENT AND PLAN / ED COURSE  As part of my medical decision making, I reviewed the following data  within the [place] notes reviewed and incorporated, Labs reviewed, EKG interpreted, Old chart reviewed, Radiograph reviewed and Notes from prior ED visits     Destiny Trujillo was evaluated in Emergency Department on 01/14/2019 for the symptoms described in the history of present illness. She was evaluated in the context of the global COVID-19 pandemic, which necessitated consideration that the patient might be at risk for infection with the SARS-CoV-2 virus that causes COVID-19. Institutional protocols and algorithms that pertain to the evaluation of patients at risk for COVID-19 are in a state of rapid change based on information released by regulatory bodies including the CDC and federal and state organizations. These policies and algorithms were followed during the patient's care in the ED.    53 year old female who presents with cold symptoms and abdominal pain which began after taking expired clindamycin. Differential diagnosis includes, but is not limited to, ovarian cyst, ovarian torsion, acute appendicitis, diverticulitis, urinary tract infection/pyelonephritis, endometriosis, bowel obstruction, colitis, renal colic, gastroenteritis, hernia, fibroids, endometriosis, pregnancy related pain including ectopic pregnancy, etc.   Patient is afebrile, laboratory results unremarkable.  UA demonstrates moderate leukocytes and ketones.  Patient is not vomiting and can hydrate orally.  I did offer and strongly encouraged patient to have COVID-19 testing but she declines.  She does mention she has grandkids that lives with her.  I did encourage patient to quarantine herself for 14 days from the onset of her cold symptoms.  Strict return precautions given.  Patient verbalizes understanding agrees with plan of care.      ____________________________________________   FINAL CLINICAL IMPRESSION(S) / ED DIAGNOSES  Final diagnoses:  Generalized abdominal pain  Lower  urinary tract infectious disease     ED Discharge Orders         Ordered    nitrofurantoin, macrocrystal-monohydrate, (MACROBID) 100 MG capsule  2 times daily     01/14/19 0007    ondansetron (ZOFRAN ODT) 4 MG disintegrating tablet  Every 8 hours PRN     01/14/19 0007           Note:  This document was prepared using Dragon voice recognition software and may include unintentional dictation errors.   Paulette Blanch, MD 01/14/19 [phone]

## 2019-01-13 NOTE — ED Triage Notes (Signed)
Patient presents to the ED with abdominal pain (queasiness) that began after patient was taking antibiotics as well as other cold medications, starting on Monday through yesterday.  Patient states she was experiencing diarrhea but since she stopped the antibiotics that stopped.  Patient reports cold symptoms for approx. 10 days.  Patient denies loss of taste or smell.  Patient denies any covid19 contacts.  Patient is in no obvious distress at this time.

## 2019-01-13 NOTE — ED Notes (Signed)
Pt states that abd pain started after Monday after taking old antibiotics.

## 2019-01-14 MED ORDER — NITROFURANTOIN MONOHYD MACRO 100 MG PO CAPS: 100.0000 mg | ORAL_CAPSULE | Freq: Two times a day (BID) | ORAL | 0 refills | Status: DC

## 2019-01-14 MED ORDER — ONDANSETRON 4 MG PO TBDP: 4.0000 mg | ORAL_TABLET | Freq: Three times a day (TID) | ORAL | 0 refills | Status: DC | PRN

## 2019-01-14 MED ORDER — CIPROFLOXACIN HCL 500 MG PO TABS: 500.0000 mg | ORAL_TABLET | Freq: Once | ORAL | Status: DC

## 2019-01-14 MED ORDER — NITROFURANTOIN MONOHYD MACRO 100 MG PO CAPS
100.0000 mg | ORAL_CAPSULE | Freq: Once | ORAL | Status: AC
  Administered 2019-01-14: 100 mg via ORAL
  Filled 2019-01-14: qty 1

## 2019-01-14 NOTE — Discharge Instructions (Signed)
1.  Take antibiotic as prescribed (Macrobid 100 mg twice daily x7 days). 2.  You may take Zofran if you develop nausea or vomiting. 3.  Urine culture is pending.  You will be notified if we need to change your antibiotic. 4.  Your symptoms suggest you have COVID-19 infection.  You declined testing tonight.  Please quarantine yourself for 14 days from the onset of your symptoms. 5.  Return to the ER for worsening symptoms, persistent vomiting, difficulty breathing or other concerns.

## 2019-01-15 ENCOUNTER — Emergency Department (HOSPITAL_COMMUNITY): Payer: Managed Care, Other (non HMO)

## 2019-01-15 ENCOUNTER — Encounter: Payer: Self-pay | Admitting: Internal Medicine

## 2019-01-15 ENCOUNTER — Other Ambulatory Visit: Payer: Self-pay

## 2019-01-15 ENCOUNTER — Encounter (HOSPITAL_COMMUNITY): Payer: Self-pay | Admitting: Pharmacy Technician

## 2019-01-15 ENCOUNTER — Emergency Department (HOSPITAL_COMMUNITY)
Admission: EM | Admit: 2019-01-15 | Discharge: 2019-01-15 | Disposition: A | Discharge status: Home / Self Care | Payer: Managed Care, Other (non HMO) | Attending: Emergency Medicine | Admitting: Emergency Medicine

## 2019-01-15 DIAGNOSIS — E119 Type 2 diabetes mellitus without complications: Secondary | ICD-10-CM | POA: Insufficient documentation

## 2019-01-15 DIAGNOSIS — Z79899 Other long term (current) drug therapy: Secondary | ICD-10-CM | POA: Insufficient documentation

## 2019-01-15 DIAGNOSIS — J209 Acute bronchitis, unspecified: Secondary | ICD-10-CM | POA: Insufficient documentation

## 2019-01-15 DIAGNOSIS — I10 Essential (primary) hypertension: Secondary | ICD-10-CM | POA: Diagnosis not present

## 2019-01-15 DIAGNOSIS — Z7982 Long term (current) use of aspirin: Secondary | ICD-10-CM | POA: Insufficient documentation

## 2019-01-15 DIAGNOSIS — Z794 Long term (current) use of insulin: Secondary | ICD-10-CM | POA: Insufficient documentation

## 2019-01-15 DIAGNOSIS — R0602 Shortness of breath: Secondary | ICD-10-CM | POA: Diagnosis present

## 2019-01-15 DIAGNOSIS — Z8709 Personal history of other diseases of the respiratory system: Secondary | ICD-10-CM | POA: Insufficient documentation

## 2019-01-15 LAB — COMPREHENSIVE METABOLIC PANEL
ALT: 19 U/L (ref 0–44)
AST: 18 U/L (ref 15–41)
Albumin: 4.3 g/dL (ref 3.5–5.0)
Alkaline Phosphatase: 78 U/L (ref 38–126)
Anion gap: 14 (ref 5–15)
BUN: 8 mg/dL (ref 6–20)
CO2: 22 mmol/L (ref 22–32)
Calcium: 9.3 mg/dL (ref 8.9–10.3)
Chloride: 99 mmol/L (ref 98–111)
Creatinine, Ser: 0.77 mg/dL (ref 0.44–1.00)
GFR calc Af Amer: >60 mL/min (ref >60)
GFR calc non Af Amer: >60 mL/min (ref >60)
Glucose, Bld: 144 mg/dL — ABNORMAL HIGH (ref 70–99)
Potassium: 3.6 mmol/L (ref 3.5–5.1)
Sodium: 135 mmol/L (ref 135–145)
Total Bilirubin: 0.3 mg/dL (ref 0.3–1.2)
Total Protein: 7.8 g/dL (ref 6.5–8.1)

## 2019-01-15 LAB — CBC WITH DIFFERENTIAL/PLATELET
Abs Immature Granulocytes: 0.02 10*3/uL (ref 0.00–0.07)
Basophils Absolute: 0 10*3/uL (ref 0.0–0.1)
Basophils Relative: 0 %
Eosinophils Absolute: 0 10*3/uL (ref 0.0–0.5)
Eosinophils Relative: 0 %
HCT: 40.5 % (ref 36.0–46.0)
Hemoglobin: 13.8 g/dL (ref 12.0–15.0)
Immature Granulocytes: 0 %
Lymphocytes Relative: 11 %
Lymphs Abs: 1 10*3/uL (ref 0.7–4.0)
MCH: 26.3 pg (ref 26.0–34.0)
MCHC: 34.1 g/dL (ref 30.0–36.0)
MCV: 77.3 fL — ABNORMAL LOW (ref 80.0–100.0)
Monocytes Absolute: 0.8 10*3/uL (ref 0.1–1.0)
Monocytes Relative: 9 %
Neutro Abs: 7.3 10*3/uL (ref 1.7–7.7)
Neutrophils Relative %: 80 %
Platelets: 322 10*3/uL (ref 150–400)
RBC: 5.24 MIL/uL — ABNORMAL HIGH (ref 3.87–5.11)
RDW: 13.6 % (ref 11.5–15.5)
WBC: 9.1 10*3/uL (ref 4.0–10.5)
nRBC: 0 % (ref 0.0–0.2)

## 2019-01-15 LAB — URINE CULTURE

## 2019-01-15 LAB — I-STAT BETA HCG BLOOD, ED (MC, WL, AP ONLY): I-stat hCG, quantitative: <5 m[IU]/mL (ref <5)

## 2019-01-15 LAB — TROPONIN I (HIGH SENSITIVITY): Troponin I (High Sensitivity): 3 ng/L (ref <18)

## 2019-01-15 MED ORDER — SODIUM CHLORIDE 0.9% FLUSH: 3.0000 mL | Freq: Once | INTRAVENOUS | Status: DC

## 2019-01-15 MED ORDER — PREDNISONE 10 MG PO TABS: 40.0000 mg | ORAL_TABLET | Freq: Every day | ORAL | 0 refills | Status: DC

## 2019-01-15 MED ORDER — PREDNISONE 10 MG PO TABS: 40.0000 mg | ORAL_TABLET | Freq: Every day | ORAL | 0 refills | Status: AC

## 2019-01-15 NOTE — ED Provider Notes (Signed)
[facility] EMERGENCY DEPARTMENT Provider Note   CSN: [id number] Arrival date & time: 01/15/19  1118     History Chief Complaint  Patient presents with   Shortness of Breath    Destiny MillinerJanice Adams Winbush is a 53 y.o. female with a past medical history of diabetes, hyperlipidemia, hypertension, asthma presenting to the ED with a chief complaint of shortness of breath.  States that this morning she woke up with acute onset of shortness of breath which gradually improved after her asthma treatments including her inhaler and the nebulizer treatment.  States that the episode was brief and did not have any chest pain or chest tightness at the time.  She has been dealing with abdominal pain and reflux symptoms for the past 3 days and was evaluated at [facility] ED last night, diagnosed with a UTI and placed on Macrobid.  She is unsure if the shortness of breath was [facility] as she is unsure if she has taken this antibiotic in the past.  She denies any sick contacts with similar symptoms.  Denies any cough, hemoptysis, wheezing, leg swelling, history of DVT, PE, MI, recent immobilization, exogenous estrogen use, fever, known COVID-19 exposures.  HPI     Past Medical History:  Diagnosis Date   Allergy    seasonal   Asthma    Depression    Diabetes mellitus without complication (HCC)    Hyperlipidemia    Hypertension     Patient Active Problem List   Diagnosis Date Noted   Routine general medical examination at a health care facility 12/10/2018   Insomnia 05/11/2018   Type 2 diabetes with complication (HCC) 03/18/2015   Essential hypertension 03/18/2015   Hyperlipidemia associated with type 2 diabetes mellitus (HCC) 03/18/2015   Asthma 03/18/2015   Morbid obesity (HCC) 03/18/2015    History reviewed. No pertinent surgical history.   OB History    Gravida  0   Para  0   Term  0   Preterm  0   AB  0   Living        SAB  0   TAB  0     Ectopic  0   Multiple      Live Births              Family History  Problem Relation Age of Onset   Hypertension Mother    Diabetes Mother    Dementia Mother    Hypertension Father    Diabetes Father    Diabetes Sister    Angina Sister    Cancer Brother    Cancer Maternal Aunt     Social History   Tobacco Use   Smoking status: Never Smoker   Smokeless tobacco: Never Used  Substance Use Topics   Alcohol use: No    Alcohol/week: 0.0 standard drinks   Drug use: No    Home Medications Prior to Admission medications   Medication Sig Start Date End Date Taking? Authorizing Provider  albuterol (VENTOLIN HFA) 108 (90 Base) MCG/ACT inhaler Inhale 1 puff into the lungs every 6 (six) hours as needed for wheezing or shortness of breath. 12/10/18   Myrlene Brokerrawford, Elizabeth A, MD  aspirin 81 MG tablet Take 81 mg by mouth daily.    [provider]  benzonatate (TESSALON) 200 MG capsule Take 1 capsule (200 mg total) by mouth 3 (three) times daily as needed. 01/11/19   Myrlene Brokerrawford, Elizabeth A, MD  diphenhydramine-acetaminophen (TYLENOL PM) 25-500 MG  TABS tablet Take 1 tablet by mouth at bedtime as needed (for pain/sleep). Reported on 07/07/2015    [provider]  fluticasone (FLONASE) 50 MCG/ACT nasal spray Place 2 sprays into both nostrils daily. 12/10/18   Myrlene Broker, MD  Fluticasone-Salmeterol (ADVAIR) 500-50 MCG/DOSE AEPB Inhale 1 puff into the lungs 2 (two) times daily. 12/10/18   Myrlene Broker, MD  Insulin Glargine (LANTUS) 100 UNIT/ML Solostar Pen Inject 40 Units into the skin daily at 8 pm. 12/10/18   Myrlene Broker, MD  ipratropium-albuterol (DUONEB) 0.5-2.5 (3) MG/3ML SOLN Take 3 mLs by nebulization every 6 (six) hours as needed. 06/01/15   Myrlene Broker, MD  lisinopril-hydrochlorothiazide (ZESTORETIC) 20-12.5 MG tablet Take 1 tablet by mouth daily. 12/10/18   Myrlene Broker, MD  loratadine (CLARITIN) 10 MG  tablet Take 1 tablet (10 mg total) by mouth daily. 02/12/17   Myrlene Broker, MD  metFORMIN (GLUCOPHAGE) 1000 MG tablet TAKE 1 TABLET (1,000 MG TOTAL) BY MOUTH 2 (TWO) TIMES A DAY WITH MEALS. 12/10/18   Myrlene Broker, MD  montelukast (SINGULAIR) 10 MG tablet Take 1 tablet (10 mg total) by mouth at bedtime. 01/11/19   Myrlene Broker, MD  Multiple Vitamin (MULTIVITAMIN) capsule Take 1 capsule by mouth daily.    [provider]  nitrofurantoin, macrocrystal-monohydrate, (MACROBID) 100 MG capsule Take 1 capsule (100 mg total) by mouth 2 (two) times daily. 01/14/19   Irean Hong, MD  ondansetron (ZOFRAN ODT) 4 MG disintegrating tablet Take 1 tablet (4 mg total) by mouth every 8 (eight) hours as needed for nausea or vomiting. 01/14/19   Irean Hong, MD  predniSONE (DELTASONE) 10 MG tablet Take 4 tablets (40 mg total) by mouth daily for 4 days. 01/15/19 01/19/19  Blandina Renaldo, PA-C  simvastatin (ZOCOR) 40 MG tablet Take 1 tablet (40 mg total) by mouth daily. 12/14/18   Myrlene Broker, MD    Allergies    Penicillins and Penicillins  Review of Systems   Review of Systems  Constitutional: Negative for appetite change, chills and fever.  HENT: Negative for ear pain, rhinorrhea, sneezing and sore throat.   Eyes: Negative for photophobia and visual disturbance.  Respiratory: Positive for shortness of breath. Negative for cough, chest tightness and wheezing.   Cardiovascular: Negative for chest pain and palpitations.  Gastrointestinal: Negative for abdominal pain, blood in stool, constipation, diarrhea, nausea and vomiting.  Genitourinary: Negative for dysuria, hematuria and urgency.  Musculoskeletal: Negative for myalgias.  Skin: Negative for rash.  Neurological: Negative for dizziness, weakness and light-headedness.    Physical Exam Updated Vital Signs BP 125/83 (BP Location: Left Arm)    Pulse 77    Temp 99.6 F (37.6 C) (Oral)    Resp 16    SpO2 98%    Physical Exam Vitals and nursing note reviewed.  Constitutional:      General: She is not in acute distress.    Appearance: She is well-developed.  HENT:     Head: Normocephalic and atraumatic.     Nose: Nose normal.  Eyes:     General: No scleral icterus.       Left eye: No discharge.     Conjunctiva/sclera: Conjunctivae normal.  Cardiovascular:     Rate and Rhythm: Normal rate and regular rhythm.     Heart sounds: Normal heart sounds. No murmur. No friction rub. No gallop.   Pulmonary:     Effort: Pulmonary effort is normal. No respiratory distress.  Breath sounds: Normal breath sounds.  Abdominal:     General: Bowel sounds are normal. There is no distension.     Palpations: Abdomen is soft.     Tenderness: There is no abdominal tenderness. There is no guarding.  Musculoskeletal:        General: Normal range of motion.     Cervical back: Normal range of motion and neck supple.  Skin:    General: Skin is warm and dry.     Findings: No rash.  Neurological:     Mental Status: She is alert.     Motor: No abnormal muscle tone.     Coordination: Coordination normal.     ED Results / Procedures / Treatments   Labs (all labs ordered are listed, but only abnormal results are displayed) Labs Reviewed  COMPREHENSIVE METABOLIC PANEL - Abnormal; Notable for the following components:      Result Value   Glucose, Bld 144 (*)    All other components within normal limits  CBC WITH DIFFERENTIAL/PLATELET - Abnormal; Notable for the following components:   RBC 5.24 (*)    MCV 77.3 (*)    All other components within normal limits  I-STAT BETA HCG BLOOD, ED ([facility], [facility], [facility] ONLY)  TROPONIN I (HIGH SENSITIVITY)    EKG None  Radiology DG Chest 2 View  Result Date: 01/15/2019 CLINICAL DATA:  Asthma and shortness of breath. EXAM: CHEST - 2 VIEW COMPARISON:  None. FINDINGS: The heart size and mediastinal contours are within normal limits. Mild bronchial thickening present  predominantly in the perihilar lungs bilaterally and lower lung zones. Although this may be chronic, a component of acute bronchitis cannot be excluded. There is no evidence of pulmonary edema, airspace consolidation, pneumothorax, nodule or pleural fluid. The visualized skeletal structures are unremarkable. IMPRESSION: Mild bronchial thickening in the perihilar lungs and lower lung zones. Although this may be chronic, a component of acute bronchitis cannot be excluded. Electronically Signed   By: Aletta Edouard M.D.   On: 01/15/2019 12:06    Procedures Procedures (including critical care time)  Medications Ordered in ED Medications  sodium chloride flush (NS) 0.9 % injection 3 mL (3 mLs Intravenous Not Given 01/15/19 1814)    ED Course  I have reviewed the triage vital signs and the nursing notes.  Pertinent labs & imaging results that were available during my care of the patient were reviewed by me and considered in my medical decision making (see chart for details).    MDM Rules/Calculators/A&P                      53 year old female with a past medical history of diabetes, hyperlipidemia, hypertension, asthma presenting to the ED with a chief complaint of shortness of breath.  She had a brief episode of shortness of breath when she woke up this morning which improved after her asthma treatments.  Denied any previous or current chest pain.  She is unsure if this was a reaction to the Macrobid that was prescribed for her yesterday for UTI.  She does endorse increased belching.  On my exam she is overall well-appearing. No lower extremity edema, erythema or calf tenderness bilaterally that would concern me for DVT.  Her vital signs are reassuring.  EKG shows sinus tachycardia without ischemic changes.  Chest x-ray shows possible flareup of acute bronchitis.  CMP, CBC, troponin unremarkable.  Patient is symptom-free during her ED visit.  Suspect that this episode of shortness of  breath that  improved with her asthma medications could be due to bronchitis.  Will give short course of prednisone.  States that she usually takes an extra dose of her insulin when she is on prednisone and will keep an eye on her sugars.  I doubt ACS, PE or other emergent cause of her symptoms as her vital signs are reassuring here.  She is requesting discharge home.  We will have her follow-up with PCP and return for worsening symptoms.  Patient is hemodynamically stable, in NAD, and able to ambulate in the ED. Evaluation does not show pathology that would require ongoing emergent intervention or inpatient treatment. I explained the diagnosis to the patient. Pain has been managed and has no complaints prior to discharge. Patient is comfortable with above plan and is stable for discharge at this time. All questions were answered prior to disposition. Strict return precautions for returning to the ED were discussed. Encouraged follow up with PCP.   An After Visit Summary was printed and given to the patient.   Portions of this note were generated with Scientist, clinical (histocompatibility and immunogenetics). Dictation errors may occur despite best attempts at proofreading.  Final Clinical Impression(s) / ED Diagnoses Final diagnoses:  Acute bronchitis, unspecified organism    Rx / DC Orders ED Discharge Orders         Ordered    predniSONE (DELTASONE) 10 MG tablet  Daily,   Status:  Discontinued     01/15/19 1833    predniSONE (DELTASONE) 10 MG tablet  Daily     01/15/19 1834           Dietrich Pates, PA-C 01/15/19 1834    Lorre Nick, MD 01/16/19 1202

## 2019-01-15 NOTE — Discharge Instructions (Signed)
Continue your home medications as previously prescribed. Return to the ED if you start to have worsening symptoms, develop chest pain, shortness of breath, leg swelling, vomiting or coughing up blood.

## 2019-01-15 NOTE — ED Triage Notes (Signed)
Pt arrives via pov with reports of sudden onset of sob this morning. Pt states hx of asthma but that this feels different. Pt speaking in complete sentences.

## 2019-01-25 ENCOUNTER — Encounter: Payer: Self-pay | Admitting: Internal Medicine

## 2019-01-25 ENCOUNTER — Telehealth: Payer: Managed Care, Other (non HMO) | Admitting: Physician Assistant

## 2019-01-25 DIAGNOSIS — J45901 Unspecified asthma with (acute) exacerbation: Secondary | ICD-10-CM | POA: Diagnosis not present

## 2019-01-25 MED ORDER — PREDNISONE 20 MG PO TABS: 40.0000 mg | ORAL_TABLET | Freq: Every day | ORAL | 0 refills | Status: AC

## 2019-01-25 NOTE — Progress Notes (Signed)
Visit for Asthma  Based on what you have shared with me, it looks like you may have a flare up of your asthma. Please schedule a follow-up visit with your PCP, Dr. Okey Dupre, for later this week or early next week.  She needs to be sure your asthma is under control with your current daily medications.    Asthma is a chronic (ongoing) lung disease which results in airway obstruction, inflammation and hyper-responsiveness.   Asthma symptoms vary from person to person, with common symptoms including nighttime awakening and decreased ability to participate in normal activities as a result of shortness of breath. It is often triggered by changes in weather, changes in the season, changes in air temperature, or inside (home, school, daycare or work) allergens such as animal dander, mold, mildew, woodstoves or cockroaches.   It can also be triggered by hormonal changes, extreme emotion, physical exertion or an upper respiratory tract illness.     It is important to identify the trigger, and then eliminate or avoid the trigger if possible.   If you have been prescribed medications to be taken on a regular basis, it is important to follow the asthma action plan and to follow guidelines to adjust medication in response to increasing symptoms of decreased peak expiratory flow rate  Treatment: I have prescribed: Prednisone 40mg  by mouth per day for 5 - 7 days Continue using your Albuterol inhaler every 4-6 hours as needed.  Please monitor your blood sugars while taking this medication.   HOME CARE . Only take medications as instructed by your medical team. . Consider wearing a mask or scarf to improve breathing air temperature have been shown to decrease irritation and decrease exacerbations . Get rest. . Taking a steamy shower or using a humidifier may help nasal congestion sand ease sore throat  pain. You can place a towel over your head and breathe in the steam from hot water coming from a faucet. . Using a saline nasal spray works much the same way.  . Cough drops, hare candies and sore throat lozenges may ease your cough.  . Avoid close contacts especially the very you and the elderly . Cover your mouth if you cough or sneeze . Always remember to wash your hands.    GET HELP RIGHT AWAY IF: . You develop worsening symptoms; breathlessness at rest, drowsy, confused or agitated, unable to speak in full sentences . You have coughing fits . You develop a severe headache or visual changes . You develop shortness of breath, difficulty breathing or start having chest pain . Your symptoms persist after you have completed your treatment plan . If your symptoms do not improve within 10 days  MAKE SURE YOU . Understand these instructions. . Will watch your condition. . Will get help right away if you are not doing well or get worse.   Your e-visit answers were reviewed by a board certified advanced clinical practitioner to complete your personal care plan, Depending upon the condition, your plan could have included both over the counter or prescription medications.  Please review your pharmacy choice. Your safety is important to . If you have drug allergies check your prescription carefully. You can use MyChart to ask questions about today's visit, request a non-urgent call back, or ask for a work or school excuse for 24 hours related to this e-Visit. If it has been greater than 24 hours you will need to follow up with your provider, or enter a new e-Visit to address  those concerns.  You will get an e-mail in the next two days asking about your experience. I hope that your e-visit has been valuable and will speed your recovery. Thank you for using e-visits.  Greater than 5 minutes, yet less than 10 minutes of time have been spent researching, coordinating and implementing care for this  patient today.

## 2019-02-09 ENCOUNTER — Encounter: Payer: Self-pay | Admitting: Internal Medicine

## 2019-02-11 ENCOUNTER — Telehealth: Payer: Self-pay

## 2019-02-11 MED ORDER — MONTELUKAST SODIUM 10 MG PO TABS: 10.0000 mg | ORAL_TABLET | Freq: Every day | ORAL | 3 refills | Status: DC

## 2019-02-11 NOTE — Telephone Encounter (Signed)
New message     1. Which medications need to be refilled? (please list name of each medication and dose if known) montelukast (SINGULAIR) 10 MG tablet  2. Which pharmacy/location (including street and city if local pharmacy) is medication to be sent to?[facility]   3. Do they need a 30 day or 90 day supply?  90 days supply

## 2019-02-11 NOTE — Telephone Encounter (Signed)
Refill sent. See meds.  

## 2019-02-12 ENCOUNTER — Ambulatory Visit: Payer: Managed Care, Other (non HMO) | Admitting: Internal Medicine

## 2019-02-22 ENCOUNTER — Encounter: Payer: Self-pay | Admitting: Internal Medicine

## 2019-02-22 NOTE — Telephone Encounter (Signed)
   Scheduler left message vcml to reschedule appt

## 2019-03-01 ENCOUNTER — Encounter: Payer: Self-pay | Admitting: Internal Medicine

## 2019-03-02 NOTE — Telephone Encounter (Signed)
LVM for patient to call back. Not sure what type of appointment she is needing. She is not due for a physical.

## 2019-03-17 ENCOUNTER — Ambulatory Visit (INDEPENDENT_AMBULATORY_CARE_PROVIDER_SITE_OTHER): Payer: Managed Care, Other (non HMO) | Admitting: Internal Medicine

## 2019-03-17 ENCOUNTER — Other Ambulatory Visit: Payer: Self-pay

## 2019-03-17 ENCOUNTER — Encounter: Payer: Self-pay | Admitting: Internal Medicine

## 2019-03-17 VITALS — BP 118/58 | HR 85 | Temp 99.4°F | Ht 59.0 in | Wt 164.0 lb

## 2019-03-17 DIAGNOSIS — E118 Type 2 diabetes mellitus with unspecified complications: Secondary | ICD-10-CM | POA: Diagnosis not present

## 2019-03-17 LAB — POCT GLYCOSYLATED HEMOGLOBIN (HGB A1C): Hemoglobin A1C: 7.2 % — AB (ref 4.0–5.6)

## 2019-03-17 NOTE — Progress Notes (Signed)
   Subjective:   Patient ID: Destiny Trujillo, female    DOB: Apr 27, 1965, 54 y.o.   MRN: [id number]  HPI The patient is a 54 YO female coming in for follow up diabetes. Denies changes in diet. Denies excessive thirst or urination. Denies low sugars. Denies numbness in feet.   Review of Systems  Constitutional: Negative.   HENT: Negative.   Eyes: Negative.   Respiratory: Negative for cough, chest tightness and shortness of breath.   Cardiovascular: Negative for chest pain, palpitations and leg swelling.  Gastrointestinal: Negative for abdominal distention, abdominal pain, constipation, diarrhea, nausea and vomiting.  Musculoskeletal: Negative.   Skin: Negative.   Neurological: Negative.   Psychiatric/Behavioral: Negative.     Objective:  Physical Exam Constitutional:      Appearance: She is well-developed.  HENT:     Head: Normocephalic and atraumatic.  Cardiovascular:     Rate and Rhythm: Normal rate and regular rhythm.  Pulmonary:     Effort: Pulmonary effort is normal. No respiratory distress.     Breath sounds: Normal breath sounds. No wheezing or rales.  Abdominal:     General: Bowel sounds are normal. There is no distension.     Palpations: Abdomen is soft.     Tenderness: There is no abdominal tenderness. There is no rebound.  Musculoskeletal:     Cervical back: Normal range of motion.  Skin:    General: Skin is warm and dry.  Neurological:     Mental Status: She is alert and oriented to person, place, and time.     Coordination: Coordination normal.     Vitals:   03/17/19 1033  BP: (!) 118/58  Pulse: 85  Temp: 99.4 F (37.4 C)  TempSrc: Oral  SpO2: 96%  Weight: 164 lb (74.4 kg)  Height: 4\' 11"  (1.499 m)    This visit occurred during the SARS-CoV-2 public health emergency.  Safety protocols were in place, including screening questions prior to the visit, additional usage of staff PPE, and extensive cleaning of exam room while observing appropriate  contact time as indicated for disinfecting solutions.   Assessment & Plan:

## 2019-03-17 NOTE — Patient Instructions (Addendum)
The HgA1c is 7.2 which is good.   No changes needed to the medications.

## 2019-03-19 NOTE — Assessment & Plan Note (Signed)
POC HgA1c done and still at goal. Keep metformin and lantus.

## 2019-04-29 ENCOUNTER — Encounter: Payer: Self-pay | Admitting: Internal Medicine

## 2019-08-02 ENCOUNTER — Encounter: Payer: Self-pay | Admitting: Internal Medicine

## 2019-08-05 ENCOUNTER — Encounter: Payer: Managed Care, Other (non HMO) | Admitting: Internal Medicine

## 2019-08-19 ENCOUNTER — Other Ambulatory Visit: Payer: Self-pay

## 2019-08-19 ENCOUNTER — Encounter: Payer: Self-pay | Admitting: Internal Medicine

## 2019-08-19 ENCOUNTER — Ambulatory Visit (INDEPENDENT_AMBULATORY_CARE_PROVIDER_SITE_OTHER): Payer: PRIVATE HEALTH INSURANCE | Admitting: Internal Medicine

## 2019-08-19 VITALS — BP 132/84 | HR 84 | Temp 98.1°F | Ht 59.0 in | Wt 171.0 lb

## 2019-08-19 DIAGNOSIS — E785 Hyperlipidemia, unspecified: Secondary | ICD-10-CM | POA: Diagnosis not present

## 2019-08-19 DIAGNOSIS — E1169 Type 2 diabetes mellitus with other specified complication: Secondary | ICD-10-CM | POA: Diagnosis not present

## 2019-08-19 DIAGNOSIS — E118 Type 2 diabetes mellitus with unspecified complications: Secondary | ICD-10-CM

## 2019-08-19 LAB — POCT GLYCOSYLATED HEMOGLOBIN (HGB A1C): Hemoglobin A1C: 7 % — AB (ref 4.0–5.6)

## 2019-08-19 MED ORDER — IPRATROPIUM-ALBUTEROL 0.5-2.5 (3) MG/3ML IN SOLN: 3.0000 mL | Freq: Four times a day (QID) | RESPIRATORY_TRACT | 0 refills | Status: DC | PRN

## 2019-08-19 MED ORDER — SIMVASTATIN 40 MG PO TABS: 40.0000 mg | ORAL_TABLET | Freq: Every day | ORAL | 3 refills | Status: DC

## 2019-08-19 MED ORDER — ALBUTEROL SULFATE HFA 108 (90 BASE) MCG/ACT IN AERS: 1.0000 | INHALATION_SPRAY | Freq: Four times a day (QID) | RESPIRATORY_TRACT | 1 refills | Status: DC | PRN

## 2019-08-19 MED ORDER — MONTELUKAST SODIUM 10 MG PO TABS: 10.0000 mg | ORAL_TABLET | Freq: Every day | ORAL | 3 refills | Status: DC

## 2019-08-19 MED ORDER — METFORMIN HCL 1000 MG PO TABS: ORAL_TABLET | ORAL | 3 refills | Status: DC

## 2019-08-19 MED ORDER — FLUTICASONE-SALMETEROL 500-50 MCG/DOSE IN AEPB: 1.0000 | INHALATION_SPRAY | Freq: Two times a day (BID) | RESPIRATORY_TRACT | 3 refills | Status: DC

## 2019-08-19 MED ORDER — LISINOPRIL-HYDROCHLOROTHIAZIDE 20-12.5 MG PO TABS: 1.0000 | ORAL_TABLET | Freq: Every day | ORAL | 3 refills | Status: DC

## 2019-08-19 MED ORDER — FLUTICASONE PROPIONATE 50 MCG/ACT NA SUSP: 2.0000 | Freq: Every day | NASAL | 6 refills | Status: DC

## 2019-08-19 MED ORDER — INSULIN GLARGINE 100 UNIT/ML SOLOSTAR PEN: 40.0000 [IU] | PEN_INJECTOR | Freq: Every day | SUBCUTANEOUS | 11 refills | Status: DC

## 2019-08-19 NOTE — Assessment & Plan Note (Signed)
Still taking statin and refilled today simvastatin 40 mg daily.

## 2019-08-19 NOTE — Patient Instructions (Addendum)
We have checked the sugar levels today and they are doing well at 7.0  You do need to stop seeing 2 different primary care doctors otherwise we will not be able to continue taking care of you.   Diabetes Mellitus and Standards of Medical Care Managing diabetes (diabetes mellitus) can be complicated. Your diabetes treatment may be managed by a team of health care providers, including:  A physician who specializes in diabetes (endocrinologist).  A nurse practitioner or physician assistant.  Nurses.  A diet and nutrition specialist (registered dietitian).  A certified diabetes educator (CDE).  An exercise specialist.  A pharmacist.  An eye doctor.  A foot specialist (podiatrist).  A dentist.  A primary care provider.  A mental health provider. Your health care providers follow guidelines to help you get the best quality of care. The following schedule is a general guideline for your diabetes management plan. Your health care providers may give you more specific instructions. Physical exams Upon being diagnosed with diabetes mellitus, and each year after that, your health care provider will ask about your medical and family history. He or she will also do a physical exam. Your exam may include:  Measuring your height, weight, and body mass index (BMI).  Checking your blood pressure. This will be done at every routine medical visit. Your target blood pressure may vary depending on your medical conditions, your age, and other factors.  Thyroid gland exam.  Skin exam.  Screening for damage to your nerves (peripheral neuropathy). This may include checking the pulse in your legs and feet and checking the level of sensation in your hands and feet.  A complete foot exam to inspect the structure and skin of your feet, including checking for cuts, bruises, redness, blisters, sores, or other problems.  Screening for blood vessel (vascular) problems, which may include checking the  pulse in your legs and feet and checking your temperature. Blood tests Depending on your treatment plan and your personal needs, you may have the following tests done:  HbA1c (hemoglobin A1c). This test provides information about blood sugar (glucose) control over the previous 2-3 months. It is used to adjust your treatment plan, if needed. This test will be done: ? At least 2 times a year, if you are meeting your treatment goals. ? 4 times a year, if you are not meeting your treatment goals or if treatment goals have changed.  Lipid testing, including total, LDL, and HDL cholesterol and triglyceride levels. ? The goal for LDL is less than 100 mg/dL (5.5 mmol/L). If you are at high risk for complications, the goal is less than 70 mg/dL (3.9 mmol/L). ? The goal for HDL is 40 mg/dL (2.2 mmol/L) or higher for men and 50 mg/dL (2.8 mmol/L) or higher for women. An HDL cholesterol of 60 mg/dL (3.3 mmol/L) or higher gives some protection against heart disease. ? The goal for triglycerides is less than 150 mg/dL (8.3 mmol/L).  Liver function tests.  Kidney function tests.  Thyroid function tests. Dental and eye exams  Visit your dentist two times a year.  If you have type 1 diabetes, your health care provider may recommend an eye exam 3-5 years after you are diagnosed, and then once a year after your first exam. ? For children with type 1 diabetes, a health care provider may recommend an eye exam when your child is age 8 or older and has had diabetes for 3-5 years. After the first exam, your child should get an  eye exam once a year.  If you have type 2 diabetes, your health care provider may recommend an eye exam as soon as you are diagnosed, and then once a year after your first exam. Immunizations   The yearly flu (influenza) vaccine is recommended for everyone 6 months or older who has diabetes.  The pneumonia (pneumococcal) vaccine is recommended for everyone 2 years or older who has  diabetes. If you are 9 or older, you may get the pneumonia vaccine as a series of two separate shots.  The hepatitis B vaccine is recommended for adults shortly after being diagnosed with diabetes.  Adults and children with diabetes should receive all other vaccines according to age-specific recommendations from the Centers for Disease Control and Prevention (CDC). Mental and emotional health Screening for symptoms of eating disorders, anxiety, and depression is recommended at the time of diagnosis and afterward as needed. If your screening shows that you have symptoms (positive screening result), you may need more evaluation and you may work with a mental health care provider. Treatment plan Your treatment plan will be reviewed at every medical visit. You and your health care provider will discuss:  How you are taking your medicines, including insulin.  Any side effects you are experiencing.  Your blood glucose target goals.  The frequency of your blood glucose monitoring.  Lifestyle habits, such as activity level as well as tobacco, alcohol, and substance use. Diabetes self-management education Your health care provider will assess how well you are monitoring your blood glucose levels and whether you are taking your insulin correctly. He or she may refer you to:  A certified diabetes educator to manage your diabetes throughout your life, starting at diagnosis.  A registered dietitian who can create or review your personal nutrition plan.  An exercise specialist who can discuss your activity level and exercise plan. Summary  Managing diabetes (diabetes mellitus) can be complicated. Your diabetes treatment may be managed by a team of health care providers.  Your health care providers follow guidelines in order to help you get the best quality of care.  Standards of care including having regular physical exams, blood tests, blood pressure monitoring, immunizations, screening tests,  and education about how to manage your diabetes.  Your health care providers may also give you more specific instructions based on your individual health. This information is not intended to replace advice given to you by your health care provider. Make sure you discuss any questions you have with your health care provider. Document Revised: 09/26/2017 Document Reviewed: 10/06/2015 Elsevier Patient Education  2020 [employer].

## 2019-08-19 NOTE — Progress Notes (Signed)
   Subjective:   Patient ID: Destiny Trujillo, female    DOB: [birth year], 54 y.o.   MRN: [id number]  HPI The patient is a 54 YO female coming in for follow up of her diabetes (she is still taking lantus and metformin, denies side effects, denies excessive thirst or urination, no new numbness or tingling in feet or hands). Denies new concerns. Has still been seeing multiple primary care providers since our last visit although she was advised she would need to pick only 1 primary care provider as she is getting duplicative care which is not good for her. She again was confused about her medical insurance being taken by [place] and had kept going to [place] [facility] thinking she had to go there. She would like to stay here for her medical care. She is advised she will need to stop going to [facility].   Review of Systems  Constitutional: Negative.   HENT: Negative.   Eyes: Negative.   Respiratory: Negative for cough, chest tightness and shortness of breath.   Cardiovascular: Negative for chest pain, palpitations and leg swelling.  Gastrointestinal: Negative for abdominal distention, abdominal pain, constipation, diarrhea, nausea and vomiting.  Musculoskeletal: Negative.   Skin: Negative.   Neurological: Negative.   Psychiatric/Behavioral: Negative.     Objective:  Physical Exam Constitutional:      Appearance: She is well-developed.  HENT:     Head: Normocephalic and atraumatic.  Cardiovascular:     Rate and Rhythm: Normal rate and regular rhythm.  Pulmonary:     Effort: Pulmonary effort is normal. No respiratory distress.     Breath sounds: Normal breath sounds. No wheezing or rales.  Abdominal:     General: Bowel sounds are normal. There is no distension.     Palpations: Abdomen is soft.     Tenderness: There is no abdominal tenderness. There is no rebound.  Musculoskeletal:     Cervical back: Normal range of motion.  Skin:    General: Skin is warm and dry.  Neurological:     Mental  Status: She is alert and oriented to person, place, and time.     Coordination: Coordination normal.     Vitals:   08/19/19 0901  BP: (!) 132/84  Pulse: 84  Temp: 98.1 F (36.7 C)  TempSrc: Oral  SpO2: 97%  Weight: 171 lb (77.6 kg)  Height: 4\' 11"  (1.499 m)    This visit occurred during the SARS-CoV-2 public health emergency.  Safety protocols were in place, including screening questions prior to the visit, additional usage of staff PPE, and extensive cleaning of exam room while observing appropriate contact time as indicated for disinfecting solutions.   Assessment & Plan:

## 2019-08-19 NOTE — Assessment & Plan Note (Signed)
POC HgA1c done in office at 7.0. Keep lantus and metformin the same. Counseled on standards for care and the need to avoid duplicative care with multiple providers as she is getting services too close together that she does not need. We also talked about how medication changes are not communicated with multiple providers and she can get substandard care.

## 2019-11-16 ENCOUNTER — Telehealth: Payer: Self-pay | Admitting: Internal Medicine

## 2019-11-16 MED ORDER — METFORMIN HCL 1000 MG PO TABS: ORAL_TABLET | ORAL | 3 refills | Status: DC

## 2019-11-16 MED ORDER — ALBUTEROL SULFATE HFA 108 (90 BASE) MCG/ACT IN AERS: 1.0000 | INHALATION_SPRAY | Freq: Four times a day (QID) | RESPIRATORY_TRACT | 1 refills | Status: DC | PRN

## 2019-11-16 MED ORDER — MONTELUKAST SODIUM 10 MG PO TABS: 10.0000 mg | ORAL_TABLET | Freq: Every day | ORAL | 1 refills | Status: DC

## 2019-11-16 MED ORDER — SIMVASTATIN 40 MG PO TABS: 40.0000 mg | ORAL_TABLET | Freq: Every day | ORAL | 1 refills | Status: DC

## 2019-11-16 NOTE — Telephone Encounter (Signed)
ADVAIR DISKUS 500-50 MCG/DOSE AEPB  metFORMIN (GLUCOPHAGE) 1000 MG tablet montelukast (SINGULAIR) 10 MG tablet simvastatin (ZOCOR) 40 MG tablet albuterol (VENTOLIN HFA) 108 (90 Base) MCG/ACT inhaler  [facility] calling requesting they get sent there for patients refills [facility]- [phone] [phone]

## 2020-08-08 ENCOUNTER — Encounter: Payer: Self-pay | Admitting: Internal Medicine

## 2020-08-08 MED ORDER — PREDNISONE 10 MG PO TABS: ORAL_TABLET | ORAL | 0 refills | Status: DC

## 2020-08-08 MED ORDER — IPRATROPIUM-ALBUTEROL 0.5-2.5 (3) MG/3ML IN SOLN
3.0000 mL | Freq: Four times a day (QID) | RESPIRATORY_TRACT | 0 refills | Status: DC | PRN
Start: 2020-08-08 — End: 2021-02-21

## 2020-08-08 NOTE — Telephone Encounter (Signed)
Pt states she is having SOB x 2 days that worse at night; slightly better with Albuterol inhaler & Advil.  States she is taking Advair & did 1 neb treatment last night that was also helpful.  States when this has happened in the past, PCP prescribes prednisone.  Confirms that last prednisone treatment was Jan 2021.  Request refill of neb solution due to close to expiring.  Pt has annual OV with PCP on 08/21/20. Denies fever, h/a, cough.

## 2020-08-08 NOTE — Telephone Encounter (Signed)
Ok this is done 

## 2020-08-08 NOTE — Addendum Note (Signed)
Addended by: Corwin Levins on: 08/08/2020 02:37 PM   Modules accepted: Orders

## 2020-08-21 ENCOUNTER — Telehealth: Payer: Self-pay | Admitting: Internal Medicine

## 2020-08-21 ENCOUNTER — Encounter: Payer: Self-pay | Admitting: Internal Medicine

## 2020-08-21 ENCOUNTER — Ambulatory Visit (INDEPENDENT_AMBULATORY_CARE_PROVIDER_SITE_OTHER): Payer: BC Managed Care – PPO | Admitting: Internal Medicine

## 2020-08-21 ENCOUNTER — Other Ambulatory Visit: Payer: Self-pay

## 2020-08-21 VITALS — BP 128/76 | HR 80 | Temp 98.6°F | Resp 18 | Ht 59.0 in | Wt 186.0 lb

## 2020-08-21 DIAGNOSIS — E1169 Type 2 diabetes mellitus with other specified complication: Secondary | ICD-10-CM

## 2020-08-21 DIAGNOSIS — Z0000 Encounter for general adult medical examination without abnormal findings: Secondary | ICD-10-CM

## 2020-08-21 DIAGNOSIS — E118 Type 2 diabetes mellitus with unspecified complications: Secondary | ICD-10-CM | POA: Diagnosis not present

## 2020-08-21 DIAGNOSIS — E785 Hyperlipidemia, unspecified: Secondary | ICD-10-CM | POA: Diagnosis not present

## 2020-08-21 DIAGNOSIS — I10 Essential (primary) hypertension: Secondary | ICD-10-CM

## 2020-08-21 DIAGNOSIS — J4530 Mild persistent asthma, uncomplicated: Secondary | ICD-10-CM

## 2020-08-21 LAB — CBC
HCT: 36.8 % (ref 36.0–46.0)
Hemoglobin: 12 g/dL (ref 12.0–15.0)
MCHC: 32.7 g/dL (ref 30.0–36.0)
MCV: 79.4 fl (ref 78.0–100.0)
Platelets: 263 10*3/uL (ref 150.0–400.0)
RBC: 4.63 Mil/uL (ref 3.87–5.11)
RDW: 14.5 % (ref 11.5–15.5)
WBC: 7.9 10*3/uL (ref 4.0–10.5)

## 2020-08-21 LAB — COMPREHENSIVE METABOLIC PANEL
ALT: 16 U/L (ref 0–35)
AST: 14 U/L (ref 0–37)
Albumin: 4.1 g/dL (ref 3.5–5.2)
Alkaline Phosphatase: 79 U/L (ref 39–117)
BUN: 8 mg/dL (ref 6–23)
CO2: 31 mEq/L (ref 19–32)
Calcium: 9.7 mg/dL (ref 8.4–10.5)
Chloride: 102 mEq/L (ref 96–112)
Creatinine, Ser: 0.83 mg/dL (ref 0.40–1.20)
GFR: 79.03 mL/min (ref >60.00)
Glucose, Bld: 101 mg/dL — ABNORMAL HIGH (ref 70–99)
Potassium: 3.8 mEq/L (ref 3.5–5.1)
Sodium: 139 mEq/L (ref 135–145)
Total Bilirubin: 0.2 mg/dL (ref 0.2–1.2)
Total Protein: 7.4 g/dL (ref 6.0–8.3)

## 2020-08-21 LAB — LIPID PANEL
Cholesterol: 148 mg/dL (ref 0–200)
HDL: 55.6 mg/dL (ref >39.00)
LDL Cholesterol: 72 mg/dL (ref 0–99)
NonHDL: 92.14
Total CHOL/HDL Ratio: 3
Triglycerides: 101 mg/dL (ref 0.0–149.0)
VLDL: 20.2 mg/dL (ref 0.0–40.0)

## 2020-08-21 LAB — HEMOGLOBIN A1C: Hgb A1c MFr Bld: 7.8 % — ABNORMAL HIGH (ref 4.6–6.5)

## 2020-08-21 NOTE — Telephone Encounter (Signed)
Patient was seen 8.1.22 and forgot to ask for a refill on her SENSOR and TRANSMITTER   Please advise  [id number] - [place], [place] - [address]

## 2020-08-21 NOTE — Patient Instructions (Addendum)
We will see you back in 6 months. If you continue to go to multiple primary care doctors we will not be able to see you any longer so please pick only 1 doctor.   We will check the labs today.

## 2020-08-21 NOTE — Assessment & Plan Note (Signed)
Flu shot yearly. Covid-19 due for booster counseled. Pneumonia due at 64. Shingrix counseled declines today. Tetanus due 2024. Colonoscopy she states up to date will get records. Mammogram she states up to date need records, pap smear states up to date we need records. Counseled about sun safety and mole surveillance. Counseled about the dangers of distracted driving. Given 10 year screening recommendations.

## 2020-08-21 NOTE — Assessment & Plan Note (Signed)
Checking lipid panel and adjust simvastatin 40 mg daily as needed for LDL <70.

## 2020-08-21 NOTE — Telephone Encounter (Signed)
See my chart message

## 2020-08-21 NOTE — Assessment & Plan Note (Signed)
Recent prednisone but prior was > 1 year before that. She is taking advair 500/50 BID which does well most of the time. Has albuterol inhaler and nebulizer available prn. Continue for now with recent flare but can consider decrease in dose of advair if doing well in 6 months.

## 2020-08-21 NOTE — Progress Notes (Signed)
   Subjective:   Patient ID: Destiny Trujillo, female    DOB: October 01, 1965, 55 y.o.   MRN: [id number]  HPI The patient is a 55 YO female coming in for physical. Still seeing another primary care provider since our last visit although she states she has changed insurance as of this year and will not be going back to the other location.   PMH, [facility], social history reviewed and updated  Review of Systems  Constitutional: Negative.   HENT: Negative.    Eyes: Negative.   Respiratory:  Negative for cough, chest tightness and shortness of breath.   Cardiovascular:  Negative for chest pain, palpitations and leg swelling.  Gastrointestinal:  Negative for abdominal distention, abdominal pain, constipation, diarrhea, nausea and vomiting.  Musculoskeletal: Negative.   Skin: Negative.   Neurological: Negative.   Psychiatric/Behavioral: Negative.     Objective:  Physical Exam Constitutional:      Appearance: She is well-developed. She is obese.  HENT:     Head: Normocephalic and atraumatic.  Cardiovascular:     Rate and Rhythm: Normal rate and regular rhythm.  Pulmonary:     Effort: Pulmonary effort is normal. No respiratory distress.     Breath sounds: Normal breath sounds. No wheezing or rales.  Abdominal:     General: Bowel sounds are normal. There is no distension.     Palpations: Abdomen is soft.     Tenderness: There is no abdominal tenderness. There is no rebound.  Musculoskeletal:     Cervical back: Normal range of motion.  Skin:    General: Skin is warm and dry.     Comments: Foot exam done  Neurological:     Mental Status: She is alert and oriented to person, place, and time.     Coordination: Coordination normal.    Vitals:   08/21/20 0904  BP: 128/76  Pulse: 80  Resp: 18  Temp: 98.6 F (37 C)  TempSrc: Oral  SpO2: 98%  Weight: 186 lb (84.4 kg)  Height: 4\' 11"  (1.499 m)    This visit occurred during the SARS-CoV-2 public health emergency.  Safety protocols  were in place, including screening questions prior to the visit, additional usage of staff PPE, and extensive cleaning of exam room while observing appropriate contact time as indicated for disinfecting solutions.   Assessment & Plan:

## 2020-08-21 NOTE — Assessment & Plan Note (Signed)
Foot exam done, reminded about need for eye exam not just vision exam. Checking HgA1c and lipid panel. Is on ACE-I and statin. Adjust as needed her insulin 40 units daily and metformin 1000 mg BID.

## 2020-08-21 NOTE — Assessment & Plan Note (Signed)
BP at goal on lisinopril/hctz 10/12.5 mg daily and checking CMP. Adjust as needed.

## 2020-08-21 NOTE — Assessment & Plan Note (Signed)
BMI 37 but complicated by diabetes, hypertension, hyperlipidemia. She is aware of need to work on weight loss and counseled about diet and exercise.

## 2020-08-23 ENCOUNTER — Ambulatory Visit: Payer: BC Managed Care – PPO

## 2020-08-23 MED ORDER — DEXCOM G6 TRANSMITTER MISC: 11 refills | Status: DC

## 2020-09-03 ENCOUNTER — Encounter: Payer: Self-pay | Admitting: Internal Medicine

## 2020-09-17 ENCOUNTER — Encounter: Payer: Self-pay | Admitting: Internal Medicine

## 2020-09-18 ENCOUNTER — Other Ambulatory Visit: Payer: Self-pay

## 2020-09-18 MED ORDER — SIMVASTATIN 40 MG PO TABS: 40.0000 mg | ORAL_TABLET | Freq: Every day | ORAL | 1 refills | Status: DC

## 2020-09-22 ENCOUNTER — Encounter: Payer: Self-pay | Admitting: Internal Medicine

## 2020-09-22 ENCOUNTER — Other Ambulatory Visit: Payer: Self-pay

## 2020-09-22 MED ORDER — METFORMIN HCL 1000 MG PO TABS: ORAL_TABLET | ORAL | 3 refills | Status: DC

## 2020-09-22 MED ORDER — MONTELUKAST SODIUM 10 MG PO TABS: 10.0000 mg | ORAL_TABLET | Freq: Every day | ORAL | 1 refills | Status: DC

## 2020-09-22 MED ORDER — INSULIN GLARGINE 100 UNIT/ML SOLOSTAR PEN: 40.0000 [IU] | PEN_INJECTOR | Freq: Every day | SUBCUTANEOUS | 11 refills | Status: DC

## 2020-09-22 MED ORDER — LISINOPRIL-HYDROCHLOROTHIAZIDE 20-12.5 MG PO TABS: 1.0000 | ORAL_TABLET | Freq: Every day | ORAL | 3 refills | Status: DC

## 2020-10-03 ENCOUNTER — Encounter: Payer: Self-pay | Admitting: Internal Medicine

## 2020-10-04 ENCOUNTER — Encounter: Payer: Self-pay | Admitting: Internal Medicine

## 2020-10-04 ENCOUNTER — Telehealth: Payer: BC Managed Care – PPO | Admitting: Internal Medicine

## 2020-10-04 NOTE — Progress Notes (Signed)
Virtual Visit via Video Note  I connected with Destiny Trujillo on 10/04/20 at  9:20 AM EDT by a video enabled telemedicine application however she did not show up for our visit and was unavailable via telephone. 2 voicemails were left for her.  Myrlene Broker, MD

## 2020-10-05 ENCOUNTER — Encounter: Payer: Self-pay | Admitting: Family Medicine

## 2020-10-05 ENCOUNTER — Telehealth (INDEPENDENT_AMBULATORY_CARE_PROVIDER_SITE_OTHER): Payer: BC Managed Care – PPO | Admitting: Family Medicine

## 2020-10-05 VITALS — BP 110/80 | Temp 97.6°F

## 2020-10-05 DIAGNOSIS — R0981 Nasal congestion: Secondary | ICD-10-CM

## 2020-10-05 DIAGNOSIS — R059 Cough, unspecified: Secondary | ICD-10-CM | POA: Diagnosis not present

## 2020-10-05 MED ORDER — BENZONATATE 100 MG PO CAPS: 100.0000 mg | ORAL_CAPSULE | Freq: Three times a day (TID) | ORAL | 0 refills | Status: DC | PRN

## 2020-10-05 MED ORDER — AZITHROMYCIN 250 MG PO TABS: ORAL_TABLET | ORAL | 0 refills | Status: DC

## 2020-10-05 NOTE — Progress Notes (Signed)
Virtual Visit via Video Note  I connected with Destiny Trujillo  on 10/05/20 at 10:00 AM EDT by a video enabled telemedicine application and verified that I am speaking with the correct person using two identifiers.  Location patient: home, [place] Location provider:work or home office Persons participating in the virtual visit: patient, provider  I discussed the limitations of evaluation and management by telemedicine and the availability of in person appointments. The patient expressed understanding and agreed to proceed.   HPI:  Acute telemedicine visit for Cough: -Onset:over 5 days ago -Symptoms include: nasal congestion, PND, cough, intermittent pain L ear with some, coughing up a lot of thick mucus, popping, used albuterol twice with this illness - helped asthma symptoms -Denies: fever, CP, SOB, NVD, inability to to eat/drink/get out of be -Has tried:albuterol, mucinex -Pertinent past medical history: see below -Pertinent medication allergies:  Allergies  Allergen Reactions   Penicillins    Penicillins Nausea And Vomiting and Rash    Has patient had a PCN reaction causing immediate rash, facial/tongue/throat swelling, SOB or lightheadedness with hypotension: No Has patient had a PCN reaction causing severe rash involving mucus membranes or skin necrosis: Yes Has patient had a PCN reaction that required hospitalization No Has patient had a PCN reaction occurring within the last 10 years: Yes If all of the above answers are "NO", then may proceed with Cephalosporin use.  -COVID-19 vaccine status: has had 2 doses  ROS: See pertinent positives and negatives per HPI.  Past Medical History:  Diagnosis Date   Allergy    seasonal   Asthma    Depression    Diabetes mellitus without complication (HCC)    Hyperlipidemia    Hypertension     History reviewed. No pertinent surgical history.   Current Outpatient Medications:    albuterol (VENTOLIN HFA) 108 (90 Base) MCG/ACT inhaler, Inhale 1  puff into the lungs every 6 (six) hours as needed for wheezing or shortness of breath., Disp: 18 g, Rfl: 1   aspirin 81 MG tablet, Take 81 mg by mouth daily., Disp: , Rfl:    azithromycin (ZITHROMAX) 250 MG tablet, 2 tabs day 1, then one tab daily, Disp: 6 tablet, Rfl: 0   benzonatate (TESSALON PERLES) 100 MG capsule, Take 1 capsule (100 mg total) by mouth 3 (three) times daily as needed., Disp: 20 capsule, Rfl: 0   Continuous Blood Gluc Transmit (DEXCOM G6 TRANSMITTER) MISC, Use to monitor blood sugar, Disp: 1 each, Rfl: 11   fluticasone (FLONASE) 50 MCG/ACT nasal spray, Place 2 sprays into both nostrils daily., Disp: 48 g, Rfl: 6   Fluticasone-Salmeterol (ADVAIR) 500-50 MCG/DOSE AEPB, Inhale 1 puff into the lungs 2 (two) times daily., Disp: 180 each, Rfl: 3   insulin glargine (LANTUS) 100 UNIT/ML Solostar Pen, Inject 40 Units into the skin daily at 8 pm., Disp: 15 mL, Rfl: 11   ipratropium-albuterol (DUONEB) 0.5-2.5 (3) MG/3ML SOLN, Take 3 mLs by nebulization every 6 (six) hours as needed., Disp: 360 mL, Rfl: 0   lisinopril-hydrochlorothiazide (ZESTORETIC) 20-12.5 MG tablet, Take 1 tablet by mouth daily., Disp: 90 tablet, Rfl: 3   loratadine (CLARITIN) 10 MG tablet, Take 1 tablet (10 mg total) by mouth daily., Disp: 90 tablet, Rfl: 3   metFORMIN (GLUCOPHAGE) 1000 MG tablet, TAKE 1 TABLET (1,000 MG TOTAL) BY MOUTH 2 (TWO) TIMES A DAY WITH MEALS., Disp: 180 tablet, Rfl: 3   montelukast (SINGULAIR) 10 MG tablet, Take 1 tablet (10 mg total) by mouth at bedtime., Disp: 90 tablet,  Rfl: 1   Multiple Vitamin (MULTIVITAMIN) capsule, Take 1 capsule by mouth daily., Disp: , Rfl:    simvastatin (ZOCOR) 40 MG tablet, Take 1 tablet (40 mg total) by mouth daily., Disp: 90 tablet, Rfl: 1  EXAM:  VITALS per patient if applicable:  GENERAL: alert, oriented, appears well and in no acute distress  HEENT: atraumatic, conjunttiva clear, no obvious abnormalities on inspection of external nose and ears  NECK:  normal movements of the head and neck  LUNGS: on inspection no signs of respiratory distress, breathing rate appears normal, no obvious gross SOB, gasping or wheezing  CV: no obvious cyanosis  MS: moves all visible extremities without noticeable abnormality  PSYCH/NEURO: pleasant and cooperative, no obvious depression or anxiety, speech and thought processing grossly intact  ASSESSMENT AND PLAN:  Discussed the following assessment and plan:  Cough  Nasal congestion  -we discussed possible serious and likely etiologies, options for evaluation and workup, limitations of telemedicine visit vs in person visit, treatment, treatment risks and precautions. Pt is agreeable to treatment via telemedicine at this moment. Query Covid19, VURI, asthma, vs other. She reports her PCP always gives her a zpack for this and feels strongly about taking one. Discussed risks with abx, likely viral, etc. She has agreed to try Tessalon for cough, prn alb q 4-6 hours, nasal saline, INS and short course nasal decongestant and only use the abx if worsening or not improving. She also agrees to covid testing.  Work/School slipped offered: provided in patient instructions  Advised to seek prompt in person care if worsening, new symptoms arise, or if is not improving with treatment. Discussed options for inperson care if PCP office not available. Did let this patient know that I only do telemedicine on Tuesdays and Thursdays for [place]. Advised to schedule follow up visit with PCP or [facility] if any further questions or concerns to avoid delays in care.   I discussed the assessment and treatment plan with the patient. The patient was provided an opportunity to ask questions and all were answered. The patient agreed with the plan and demonstrated an understanding of the instructions.     Terressa Koyanagi, DO

## 2020-10-05 NOTE — Patient Instructions (Addendum)
   ---------------------------------------------------------------------------------------------------------------------------      WORK SLIP:  Patient Destiny Trujillo,  02/12/1965, was seen for a medical visit today, 10/05/20 . Please excuse from work for a COVID like illness. We advise 10 days minimum from the onset of symptoms (09/30/20) PLUS 1 day of no fever and improved symptoms if the covid test is positive. Will defer to employer for a sooner return to work if symptoms have resolved and the test is negative, OR, it is greater than 5 days since the positive test, symptoms are improved, and the patient can wear a high-quality, tight fitting mask such as N95 or KN95 at all times for an additional 5 days. Would also suggest COVID19 antigen testing is negative prior to return.  Sincerely: E-signature: Dr. Kriste Basque, DO [facility] Ph: [phone]   ------------------------------------------------------------------------------------------------------------------------------   HOME CARE TIPS:  -COVID19 testing information: Most pharmacies offer testing. Follow up with a [place] pharmacy or schedule a follow up video visit with your doctor if in the first 5 days of symptoms and you wish to take an antiviral.    -I sent the medication(s) we discussed to your pharmacy: Meds ordered this encounter  Medications   benzonatate (TESSALON PERLES) 100 MG capsule    Sig: Take 1 capsule (100 mg total) by mouth 3 (three) times daily as needed.    Dispense:  20 capsule    Refill:  0   azithromycin (ZITHROMAX) 250 MG tablet    Sig: 2 tabs day 1, then one tab daily    Dispense:  6 tablet    Refill:  0    -use your albuterol as needed every 4-6 hours  -can use nasal saline a few times per day if you have nasal congestion; sometimes  a short course of Afrin nasal spray for 3 days can help with symptoms as well  -stay hydrated, drink plenty of fluids and eat  small healthy meals - avoid dairy  -can take 1000 IU ( ) Vit D3 and 100-500 mg of Vit C daily per instructions  -If the Covid test is positive, check out the CDC website for more information on home care, transmission and treatment for COVID19  -follow up with your doctor in 2-3 days unless improving and feeling better  -stay home while sick, except to seek medical care. If you have COVID19, ideally it would be best to stay home for a full 10 days since the onset of symptoms PLUS one day of no fever and feeling better. Wear a good mask that fits snugly (such as N95 or KN95) if around others to reduce the risk of transmission.  It was nice to meet you today, and I really hope you are feeling better soon. I help [place] out with telemedicine visits on Tuesdays and Thursdays and am available for visits on those days. If you have any concerns or questions following this visit please schedule a follow up visit with your Primary Care doctor or seek care at a local urgent care clinic to avoid delays in care.    Seek in person care or schedule a follow up video visit promptly if your symptoms worsen, new concerns arise or you are not improving with treatment. Call 911 and/or seek emergency care if your symptoms are severe or life threatening.

## 2020-10-24 ENCOUNTER — Encounter: Payer: Self-pay | Admitting: Internal Medicine

## 2020-10-26 ENCOUNTER — Telehealth: Payer: Self-pay | Admitting: Internal Medicine

## 2020-10-26 MED ORDER — PEN NEEDLES 30G X 5 MM MISC: 11 refills | Status: DC

## 2020-10-26 NOTE — Telephone Encounter (Signed)
See below

## 2020-10-26 NOTE — Telephone Encounter (Signed)
   Pharmacy  calling  to report they do not have 30g x pen needle   Please change order to 31G X or 

## 2020-10-26 NOTE — Telephone Encounter (Signed)
Okay to change to either with same sig

## 2020-10-27 MED ORDER — INSULIN PEN NEEDLE 31G X 4 MM MISC: 1.0000 "pen " | Freq: Every day | 2 refills | Status: DC

## 2020-10-27 MED ORDER — INSULIN PEN NEEDLE 32G X 4 MM MISC: 1.0000 "pen " | Freq: Every day | 2 refills | Status: DC

## 2020-10-27 NOTE — Addendum Note (Signed)
Addended by: Manuela Schwartz on: 10/27/2020 09:42 AM   Modules accepted: Orders

## 2020-10-27 NOTE — Telephone Encounter (Signed)
New script has been sent to the pharmacy.  

## 2020-10-27 NOTE — Telephone Encounter (Signed)
Follow up message  Pharmacy calling back to request new order for pen needle  32G X4MM

## 2020-10-27 NOTE — Telephone Encounter (Signed)
New prescription has been sent to the pharmacy

## 2020-10-30 MED ORDER — INSULIN PEN NEEDLE 32G X 4 MM MISC: 1.0000 "pen " | Freq: Every day | 2 refills | Status: DC

## 2020-10-30 NOTE — Addendum Note (Signed)
Addended by: Manuela Schwartz on: 10/30/2020 02:13 PM   Modules accepted: Orders

## 2020-10-30 NOTE — Telephone Encounter (Signed)
Please resend needle 32 X order

## 2020-10-30 NOTE — Telephone Encounter (Signed)
Script has been resent

## 2020-11-28 ENCOUNTER — Ambulatory Visit: Payer: BC Managed Care – PPO | Admitting: Internal Medicine

## 2020-11-28 ENCOUNTER — Encounter: Payer: Self-pay | Admitting: Internal Medicine

## 2020-11-28 ENCOUNTER — Other Ambulatory Visit: Payer: Self-pay

## 2020-11-28 DIAGNOSIS — F3289 Other specified depressive episodes: Secondary | ICD-10-CM | POA: Diagnosis not present

## 2020-11-28 MED ORDER — TRAZODONE HCL 50 MG PO TABS: 50.0000 mg | ORAL_TABLET | Freq: Every day | ORAL | 3 refills | Status: DC

## 2020-11-28 NOTE — Patient Instructions (Signed)
We have sent in the trazodone for sleep.

## 2020-11-28 NOTE — Progress Notes (Signed)
   Subjective:   Patient ID: Destiny Trujillo, female    DOB: [birth year], 55 y.o.   MRN: [id number]  HPI The patient is a 55 YO female coming in for depression and problems sleeping. Wants to take medicine she took before which is trazodone.   Review of Systems  Constitutional: Negative.   HENT: Negative.    Eyes: Negative.   Respiratory:  Negative for cough, chest tightness and shortness of breath.   Cardiovascular:  Negative for chest pain, palpitations and leg swelling.  Gastrointestinal:  Negative for abdominal distention, abdominal pain, constipation, diarrhea, nausea and vomiting.  Musculoskeletal: Negative.   Skin: Negative.   Neurological: Negative.   Psychiatric/Behavioral:  Positive for sleep disturbance.    Objective:  Physical Exam Constitutional:      Appearance: She is well-developed.  HENT:     Head: Normocephalic and atraumatic.  Cardiovascular:     Rate and Rhythm: Normal rate and regular rhythm.  Pulmonary:     Effort: Pulmonary effort is normal. No respiratory distress.     Breath sounds: Normal breath sounds. No wheezing or rales.  Abdominal:     General: Bowel sounds are normal. There is no distension.     Palpations: Abdomen is soft.     Tenderness: There is no abdominal tenderness. There is no rebound.  Musculoskeletal:     Cervical back: Normal range of motion.  Skin:    General: Skin is warm and dry.  Neurological:     Mental Status: She is alert and oriented to person, place, and time.     Coordination: Coordination normal.    Vitals:   11/28/20 0816  BP: 130/80  Pulse: 93  Resp: 18  SpO2: 96%  Weight: 185 lb 9.6 oz (84.2 kg)  Height: 4\' 11"  (1.499 m)    This visit occurred during the SARS-CoV-2 public health emergency.  Safety protocols were in place, including screening questions prior to the visit, additional usage of staff PPE, and extensive cleaning of exam room while observing appropriate contact time as indicated for  disinfecting solutions.   Assessment & Plan:

## 2020-11-29 ENCOUNTER — Encounter: Payer: Self-pay | Admitting: Internal Medicine

## 2020-12-01 DIAGNOSIS — F32A Depression, unspecified: Secondary | ICD-10-CM | POA: Insufficient documentation

## 2020-12-01 NOTE — Assessment & Plan Note (Signed)
Rx trazodone for sleep per preference.

## 2020-12-04 ENCOUNTER — Encounter: Payer: Self-pay | Admitting: Internal Medicine

## 2020-12-06 NOTE — Telephone Encounter (Signed)
Patient calling to discuss documents  Patient requesting a call back [phone]

## 2020-12-07 NOTE — Telephone Encounter (Signed)
Patient states phone call was disconnected on her end  Please call patient

## 2020-12-07 NOTE — Telephone Encounter (Signed)
Pt called back. Call was transferred to me. I said hello 3 times with no response before ending the call.

## 2020-12-08 NOTE — Telephone Encounter (Signed)
Patient requesting a call back  *please see below*

## 2021-01-31 ENCOUNTER — Encounter: Payer: Self-pay | Admitting: Internal Medicine

## 2021-02-14 ENCOUNTER — Telehealth: Payer: BC Managed Care – PPO | Admitting: Internal Medicine

## 2021-02-14 ENCOUNTER — Other Ambulatory Visit: Payer: Self-pay

## 2021-02-21 ENCOUNTER — Other Ambulatory Visit: Payer: Self-pay | Admitting: Internal Medicine

## 2021-02-21 ENCOUNTER — Encounter: Payer: Self-pay | Admitting: Internal Medicine

## 2021-02-21 ENCOUNTER — Other Ambulatory Visit: Payer: Self-pay

## 2021-02-21 ENCOUNTER — Ambulatory Visit: Payer: BC Managed Care – PPO | Admitting: Internal Medicine

## 2021-02-21 VITALS — BP 118/80 | HR 70 | Temp 98.4°F | Ht 59.0 in | Wt 182.6 lb

## 2021-02-21 DIAGNOSIS — J4530 Mild persistent asthma, uncomplicated: Secondary | ICD-10-CM

## 2021-02-21 DIAGNOSIS — E118 Type 2 diabetes mellitus with unspecified complications: Secondary | ICD-10-CM

## 2021-02-21 DIAGNOSIS — I10 Essential (primary) hypertension: Secondary | ICD-10-CM | POA: Diagnosis not present

## 2021-02-21 LAB — POCT GLYCOSYLATED HEMOGLOBIN (HGB A1C): Hemoglobin A1C: 8.6 % — AB (ref 4.0–5.6)

## 2021-02-21 MED ORDER — METFORMIN HCL 1000 MG PO TABS: ORAL_TABLET | ORAL | 3 refills | Status: DC

## 2021-02-21 MED ORDER — SIMVASTATIN 40 MG PO TABS: 40.0000 mg | ORAL_TABLET | Freq: Every day | ORAL | 1 refills | Status: DC

## 2021-02-21 MED ORDER — INSULIN GLARGINE 100 UNIT/ML SOLOSTAR PEN: 50.0000 [IU] | PEN_INJECTOR | Freq: Every day | SUBCUTANEOUS | 11 refills | Status: DC

## 2021-02-21 MED ORDER — ALBUTEROL SULFATE HFA 108 (90 BASE) MCG/ACT IN AERS: 1.0000 | INHALATION_SPRAY | Freq: Four times a day (QID) | RESPIRATORY_TRACT | 1 refills | Status: DC | PRN

## 2021-02-21 MED ORDER — IPRATROPIUM-ALBUTEROL 0.5-2.5 (3) MG/3ML IN SOLN: 3.0000 mL | Freq: Four times a day (QID) | RESPIRATORY_TRACT | 0 refills | Status: DC | PRN

## 2021-02-21 MED ORDER — LORATADINE 10 MG PO TABS: 10.0000 mg | ORAL_TABLET | Freq: Every day | ORAL | 3 refills | Status: DC

## 2021-02-21 MED ORDER — LISINOPRIL-HYDROCHLOROTHIAZIDE 20-12.5 MG PO TABS: 1.0000 | ORAL_TABLET | Freq: Every day | ORAL | 3 refills | Status: DC

## 2021-02-21 MED ORDER — FLUTICASONE PROPIONATE 50 MCG/ACT NA SUSP: 2.0000 | Freq: Every day | NASAL | 6 refills | Status: DC

## 2021-02-21 MED ORDER — INSULIN PEN NEEDLE 32G X 4 MM MISC: 1.0000 "pen " | Freq: Every day | 2 refills | Status: DC

## 2021-02-21 MED ORDER — MONTELUKAST SODIUM 10 MG PO TABS: 10.0000 mg | ORAL_TABLET | Freq: Every day | ORAL | 1 refills | Status: DC

## 2021-02-21 MED ORDER — TRAZODONE HCL 50 MG PO TABS: 50.0000 mg | ORAL_TABLET | Freq: Every day | ORAL | 3 refills | Status: AC

## 2021-02-21 NOTE — Patient Instructions (Signed)
Your HgA1c is 8.6 so work on getting the diet back on track.

## 2021-02-21 NOTE — Progress Notes (Signed)
° °  Subjective:   Patient ID: Destiny Trujillo, female    DOB: 1965/03/21, 56 y.o.   MRN: [id number]  HPI The patient is a 56 YO female coming in for follow up.  Review of Systems  Constitutional: Negative.   HENT:  Positive for congestion and postnasal drip.   Eyes: Negative.   Respiratory:  Negative for cough, chest tightness and shortness of breath.   Cardiovascular:  Negative for chest pain, palpitations and leg swelling.  Gastrointestinal:  Negative for abdominal distention, abdominal pain, constipation, diarrhea, nausea and vomiting.  Musculoskeletal: Negative.   Skin: Negative.   Neurological: Negative.   Psychiatric/Behavioral: Negative.     Objective:  Physical Exam Constitutional:      Appearance: She is well-developed. She is obese.  HENT:     Head: Normocephalic and atraumatic.  Cardiovascular:     Rate and Rhythm: Normal rate and regular rhythm.  Pulmonary:     Effort: Pulmonary effort is normal. No respiratory distress.     Breath sounds: Normal breath sounds. No wheezing or rales.  Abdominal:     General: Bowel sounds are normal. There is no distension.     Palpations: Abdomen is soft.     Tenderness: There is no abdominal tenderness. There is no rebound.  Musculoskeletal:     Cervical back: Normal range of motion.  Skin:    General: Skin is warm and dry.  Neurological:     Mental Status: She is alert and oriented to person, place, and time.     Coordination: Coordination normal.    Vitals:   02/21/21 0914  BP: 118/80  Pulse: 70  Temp: 98.4 F (36.9 C)  TempSrc: Oral  SpO2: 98%  Weight: 182 lb 9.6 oz (82.8 kg)  Height: 4\' 11"  (1.499 m)    This visit occurred during the SARS-CoV-2 public health emergency.  Safety protocols were in place, including screening questions prior to the visit, additional usage of staff PPE, and extensive cleaning of exam room while observing appropriate contact time as indicated for disinfecting solutions.    Assessment & Plan:

## 2021-02-22 NOTE — Telephone Encounter (Signed)
I would recommend she get this elsewhere rather than changing medication. She can call local pharmacies or another mail order.

## 2021-02-23 NOTE — Assessment & Plan Note (Signed)
POC Hga1c done today at 8.6 which is moderate exacerbation for her. She admits to dietary changes over the holidays and would not like medication change. She will make dietary changes and come back in 3 months for close follow up. Refilled lantus 50 units daily, metformin 1000 mg BID.

## 2021-02-23 NOTE — Assessment & Plan Note (Signed)
Weight is stable. BMI 36.8 and complicated by diabetes, hypertension, hyperlipidemia.

## 2021-02-23 NOTE — Assessment & Plan Note (Signed)
No flare today but needs refill on her claritin, singulair and albuterol and having more drainage without the claritin and singulair.

## 2021-02-23 NOTE — Assessment & Plan Note (Signed)
BP at goal today on lisinopril/hctz 20/12.5 mg daily. Refilled as she is almost out.

## 2021-02-26 ENCOUNTER — Encounter: Payer: Self-pay | Admitting: Internal Medicine

## 2021-02-28 ENCOUNTER — Telehealth: Payer: Self-pay | Admitting: Internal Medicine

## 2021-02-28 NOTE — Telephone Encounter (Signed)
Pharmacy states a new rx has to be sent for Continuous Blood Gluc Transmit (DEXCOM G6 TRANSMITTER) MISC stating "use for 90 days" in the directions per pts insurance company

## 2021-03-01 MED ORDER — DEXCOM G6 TRANSMITTER MISC: 11 refills | Status: DC

## 2021-03-01 NOTE — Telephone Encounter (Signed)
New script has been sent to the pharmacy.  

## 2021-03-02 ENCOUNTER — Encounter: Payer: Self-pay | Admitting: Internal Medicine

## 2021-03-02 ENCOUNTER — Other Ambulatory Visit: Payer: Self-pay

## 2021-03-02 ENCOUNTER — Ambulatory Visit (INDEPENDENT_AMBULATORY_CARE_PROVIDER_SITE_OTHER): Payer: 59 | Admitting: Internal Medicine

## 2021-03-02 DIAGNOSIS — M545 Low back pain, unspecified: Secondary | ICD-10-CM | POA: Diagnosis not present

## 2021-03-02 MED ORDER — CYCLOBENZAPRINE HCL 5 MG PO TABS: 5.0000 mg | ORAL_TABLET | Freq: Three times a day (TID) | ORAL | 1 refills | Status: AC | PRN

## 2021-03-02 NOTE — Assessment & Plan Note (Signed)
Rx flexeril. Keep using aleve for pain otc as well. Likely muscular due to changes in responsibilities at work. Given safe lifting info.

## 2021-03-02 NOTE — Progress Notes (Signed)
° °  Subjective:   Patient ID: Destiny Trujillo, female    DOB: [birth year], 56 y.o.   MRN: [id number]  HPI The patient is a 56 YO female coming in for right back pain.  Review of Systems  Constitutional:  Positive for activity change. Negative for appetite change, chills, fatigue, fever and unexpected weight change.  Respiratory: Negative.    Cardiovascular: Negative.   Gastrointestinal: Negative.   Musculoskeletal:  Positive for arthralgias, back pain and myalgias. Negative for gait problem and joint swelling.  Skin: Negative.   Neurological: Negative.    Objective:  Physical Exam Constitutional:      Appearance: She is well-developed.  HENT:     Head: Normocephalic and atraumatic.  Cardiovascular:     Rate and Rhythm: Normal rate and regular rhythm.  Pulmonary:     Effort: Pulmonary effort is normal. No respiratory distress.     Breath sounds: Normal breath sounds. No wheezing or rales.  Abdominal:     General: Bowel sounds are normal. There is no distension.     Palpations: Abdomen is soft.     Tenderness: There is no abdominal tenderness. There is no rebound.  Musculoskeletal:        General: Tenderness present.     Cervical back: Normal range of motion.  Skin:    General: Skin is warm and dry.  Neurological:     Mental Status: She is alert and oriented to person, place, and time.     Coordination: Coordination normal.    Vitals:   03/02/21 1022  BP: 126/72  Pulse: 75  Resp: 18  SpO2: 96%  Weight: 183 lb 6.4 oz (83.2 kg)  Height: 4\' 11"  (1.499 m)    This visit occurred during the SARS-CoV-2 public health emergency.  Safety protocols were in place, including screening questions prior to the visit, additional usage of staff PPE, and extensive cleaning of exam room while observing appropriate contact time as indicated for disinfecting solutions.   Assessment & Plan:

## 2021-03-02 NOTE — Patient Instructions (Signed)
We will send in the flexeril to use up to twice a day for pain.

## 2021-03-03 ENCOUNTER — Encounter: Payer: Self-pay | Admitting: Internal Medicine

## 2021-03-03 ENCOUNTER — Other Ambulatory Visit: Payer: Self-pay | Admitting: Internal Medicine

## 2021-03-06 ENCOUNTER — Other Ambulatory Visit: Payer: Self-pay

## 2021-03-06 MED ORDER — DEXCOM G6 TRANSMITTER MISC: 11 refills | Status: DC

## 2021-03-10 ENCOUNTER — Encounter: Payer: Self-pay | Admitting: Internal Medicine

## 2021-04-07 ENCOUNTER — Encounter: Payer: Self-pay | Admitting: Internal Medicine

## 2021-04-10 MED ORDER — BASAGLAR KWIKPEN 100 UNIT/ML ~~LOC~~ SOPN: 50.0000 [IU] | PEN_INJECTOR | Freq: Every day | SUBCUTANEOUS | 11 refills | Status: DC

## 2021-04-18 ENCOUNTER — Encounter: Payer: Self-pay | Admitting: Internal Medicine

## 2021-04-18 ENCOUNTER — Telehealth: Payer: Self-pay | Admitting: Internal Medicine

## 2021-04-18 NOTE — Telephone Encounter (Signed)
PT visits today and provides [place] with forms to be filled out by Dr.Crawford! Forms have been left in Dr.Crawford's mail box! ? ?CB: [phone] ?

## 2021-04-18 NOTE — Telephone Encounter (Signed)
Noted  

## 2021-04-23 ENCOUNTER — Encounter: Payer: Self-pay | Admitting: Internal Medicine

## 2021-04-24 ENCOUNTER — Other Ambulatory Visit: Payer: Self-pay

## 2021-04-24 MED ORDER — FLUTICASONE-SALMETEROL 100-50 MCG/ACT IN AEPB: 1.0000 | INHALATION_SPRAY | Freq: Two times a day (BID) | RESPIRATORY_TRACT | 3 refills | Status: DC

## 2021-04-24 MED ORDER — ALBUTEROL SULFATE HFA 108 (90 BASE) MCG/ACT IN AERS: 1.0000 | INHALATION_SPRAY | Freq: Four times a day (QID) | RESPIRATORY_TRACT | 1 refills | Status: DC | PRN

## 2021-05-07 ENCOUNTER — Encounter: Payer: Self-pay | Admitting: Internal Medicine

## 2021-05-08 ENCOUNTER — Encounter: Payer: Self-pay | Admitting: Internal Medicine

## 2021-05-10 ENCOUNTER — Other Ambulatory Visit: Payer: Self-pay | Admitting: Internal Medicine

## 2021-05-23 ENCOUNTER — Encounter: Payer: Self-pay | Admitting: Internal Medicine

## 2021-05-23 ENCOUNTER — Ambulatory Visit (INDEPENDENT_AMBULATORY_CARE_PROVIDER_SITE_OTHER): Payer: 59 | Admitting: Internal Medicine

## 2021-05-23 VITALS — BP 124/72 | HR 84 | Resp 18 | Ht 59.0 in | Wt 180.2 lb

## 2021-05-23 DIAGNOSIS — E118 Type 2 diabetes mellitus with unspecified complications: Secondary | ICD-10-CM

## 2021-05-23 LAB — HEMOGLOBIN A1C: Hgb A1c MFr Bld: 7.7 % — ABNORMAL HIGH (ref 4.6–6.5)

## 2021-05-23 MED ORDER — DEXCOM G6 TRANSMITTER MISC: 11 refills | Status: DC

## 2021-05-23 NOTE — Progress Notes (Signed)
? ?  Subjective:  ? ?Patient ID: Cambrey [place], female    DOB: 02/09/1965, 56 y.o.   MRN: [id number] ? ?HPI ?The patient is a 56 YO female coming in for follow up.  ? ?Review of Systems  ?Constitutional: Negative.   ?HENT: Negative.    ?Eyes: Negative.   ?Respiratory:  Negative for cough, chest tightness and shortness of breath.   ?Cardiovascular:  Negative for chest pain, palpitations and leg swelling.  ?Gastrointestinal:  Negative for abdominal distention, abdominal pain, constipation, diarrhea, nausea and vomiting.  ?Musculoskeletal: Negative.   ?Skin: Negative.   ?Neurological: Negative.   ?Psychiatric/Behavioral: Negative.    ? ?Objective:  ?Physical Exam ?Constitutional:   ?   Appearance: She is well-developed.  ?HENT:  ?   Head: Normocephalic and atraumatic.  ?Cardiovascular:  ?   Rate and Rhythm: Normal rate and regular rhythm.  ?Pulmonary:  ?   Effort: Pulmonary effort is normal. No respiratory distress.  ?   Breath sounds: Normal breath sounds. No wheezing or rales.  ?Abdominal:  ?   General: Bowel sounds are normal. There is no distension.  ?   Palpations: Abdomen is soft.  ?   Tenderness: There is no abdominal tenderness. There is no rebound.  ?Musculoskeletal:  ?   Cervical back: Normal range of motion.  ?Skin: ?   General: Skin is warm and dry.  ?Neurological:  ?   Mental Status: She is alert and oriented to person, place, and time.  ?   Coordination: Coordination normal.  ? ? ?Vitals:  ? 05/23/21 0940  ?BP: 124/72  ?Pulse: 84  ?Resp: 18  ?SpO2: 98%  ?Weight: 180 lb 3.2 oz (81.7 kg)  ?Height: 4\' 11"  (1.499 m)  ? ? ?This visit occurred during the SARS-CoV-2 public health emergency.  Safety protocols were in place, including screening questions prior to the visit, additional usage of staff PPE, and extensive cleaning of exam room while observing appropriate contact time as indicated for disinfecting solutions.  ? ?Assessment & Plan:  ? ?

## 2021-05-23 NOTE — Patient Instructions (Signed)
We will check the sugars today. ?

## 2021-05-25 NOTE — Assessment & Plan Note (Signed)
Checking HgA1c today and adjust medications as needed. Taking basaglar 50 units daily and metformin 1000 mg BID. Adjust as needed for goal <7.5. Rx for dexcom sensor for poorly controlled HgA1c and willing to track sugars to help with dietary training. She is checking sugars already and on insulin. ?

## 2021-06-02 ENCOUNTER — Other Ambulatory Visit: Payer: Self-pay | Admitting: Internal Medicine

## 2021-06-05 ENCOUNTER — Encounter: Payer: Self-pay | Admitting: Internal Medicine

## 2021-06-07 ENCOUNTER — Other Ambulatory Visit: Payer: Self-pay

## 2021-06-07 MED ORDER — DEXCOM G6 TRANSMITTER MISC
1.0000 | 11 refills | Status: DC
  Filled 2022-05-01: qty 1, 1d supply, fill #0

## 2021-06-14 ENCOUNTER — Encounter: Payer: Self-pay | Admitting: Internal Medicine

## 2021-06-14 ENCOUNTER — Telehealth: Payer: 59 | Admitting: Family Medicine

## 2021-06-14 ENCOUNTER — Telehealth (INDEPENDENT_AMBULATORY_CARE_PROVIDER_SITE_OTHER): Payer: 59 | Admitting: Family Medicine

## 2021-06-14 DIAGNOSIS — J45901 Unspecified asthma with (acute) exacerbation: Secondary | ICD-10-CM | POA: Diagnosis not present

## 2021-06-14 DIAGNOSIS — R059 Cough, unspecified: Secondary | ICD-10-CM | POA: Diagnosis not present

## 2021-06-14 DIAGNOSIS — Z91199 Patient's noncompliance with other medical treatment and regimen due to unspecified reason: Secondary | ICD-10-CM

## 2021-06-14 MED ORDER — PREDNISONE 20 MG PO TABS: 40.0000 mg | ORAL_TABLET | Freq: Every day | ORAL | 0 refills | Status: DC

## 2021-06-14 MED ORDER — DOXYCYCLINE HYCLATE 100 MG PO TABS: 100.0000 mg | ORAL_TABLET | Freq: Two times a day (BID) | ORAL | 0 refills | Status: DC

## 2021-06-14 NOTE — Patient Instructions (Signed)
   ---------------------------------------------------------------------------------------------------------------------------      WORK SLIP:  Patient Destiny Trujillo,  09-Mar-1965, was seen for a medical visit today, 06/14/21 . Please excuse from work for illness. If Covid19 testing is positive, patient will likely be contagious for an average of 7-10 days from the onset of symptoms. Please follow CDC recommendation for 5 days home isolation followed by 5 days of wearing a mask.  If covid testing is negative patient may return to work when symptoms are improving.   Sincerely: E-signature: Dr. Colin Benton, DO [place] Ph: [phone]   ------------------------------------------------------------------------------------------------------------------------------    HOME CARE TIPS:  -COVID19 testing information: ForwardDrop.[email or web address]  Most pharmacies also offer testing and home test kits. If the Covid19 test is positive and you desire antiviral treatment, please contact a [facility] or schedule a follow up virtual visit through your primary care office or through the [facility].  Other test to treat options: ConnectRV.is?click_[email or web address]  -I sent the medication(s) we discussed to your pharmacy: Meds ordered this encounter  Medications   predniSONE (DELTASONE) 20 MG tablet    Sig: Take 2 tablets (40 mg total) by mouth daily with breakfast.    Dispense:  8 tablet    Refill:  0   doxycycline (VIBRA-TABS) 100 MG tablet    Sig: Take 1 tablet (100 mg total) by mouth 2 (two) times daily.    Dispense:  14 tablet    Refill:  0     -nasal saline sinus rinses twice daily  -stay hydrated, drink plenty of fluids and eat small healthy meals - avoid dairy   -follow up with your doctor in 2-3 days unless improving and feeling better   It was nice to meet you today, and I really hope you are  feeling better soon. I help [place] out with telemedicine visits on Tuesdays and Thursdays and am happy to help if you need a follow up virtual visit on those days. Otherwise, if you have any concerns or questions following this visit please schedule a follow up visit with your Primary Care doctor or seek care at a local urgent care clinic to avoid delays in care.    Seek in person care or schedule a follow up video visit promptly if your symptoms worsen, new concerns arise or you are not improving with treatment. Call 911 and/or seek emergency care if your symptoms are severe or life threatening.

## 2021-06-14 NOTE — Progress Notes (Signed)
Virtual Visit via Video Note  I connected with Destiny Trujillo  on 06/14/21 at  3:00 PM EDT by a video enabled telemedicine application and verified that I am speaking with the correct person using two identifiers.  Location patient: [place] Location provider:work or home office Persons participating in the virtual visit: patient, provider  I discussed the limitations and requested verbal permission for telemedicine visit. The patient expressed understanding and agreed to proceed.   HPI:  Acute telemedicine visit for cough and congestion: -Onset: 3 days ago -Symptoms include: nasal congestion, cough - coughing up lots of thick yellow mucus, some wheezing and mild sob - inhaler helps some - but requiring 4 times per day -reports usually her PCP gives her prednisone and an abx for 7 days for this -Denies: fevers, body aches, CP, malaise, NVD -no known sick contacts -Has tried: she uses advair, singulair and flonase at baseline and albuterol as needed -Pertinent past medical history: see below, usually gets flares this time of the year -Pertinent medication allergies: Allergies  Allergen Reactions   Penicillins    Penicillins Nausea And Vomiting and Rash    Has patient had a PCN reaction causing immediate rash, facial/tongue/throat swelling, SOB or lightheadedness with hypotension: No Has patient had a PCN reaction causing severe rash involving mucus membranes or skin necrosis: Yes Has patient had a PCN reaction that required hospitalization No Has patient had a PCN reaction occurring within the last 10 years: Yes If all of the above answers are "NO", then may proceed with Cephalosporin use.  -COVID-19 vaccine status:  Immunization History  Administered Date(s) Administered   Influenza Split 01/07/2013, 11/05/2013, 11/03/2014, 12/21/2016, 11/05/2018   Influenza,inj,Quad PF,6+ Mos 10/26/2014, 10/18/2015   Influenza,inj,Quad PF,6-35 Mos 12/24/2019   Influenza,inj,quad, With Preservative 01/07/2013,  11/05/2013, 10/26/2014, 10/18/2015   Influenza-Unspecified 11/03/2014, 12/21/2016, 11/05/2018   PFIZER(Purple Top)SARS-COV-2 Vaccination 09/25/2019, 10/17/2019   Pneumococcal Polysaccharide-23 07/29/2013, 03/07/2014   Tdap 06/04/2012     ROS: See pertinent positives and negatives per HPI.  Past Medical History:  Diagnosis Date   Allergy    seasonal   Asthma    Depression    Diabetes mellitus without complication (HCC)    Hyperlipidemia    Hypertension     No past surgical history on file.   Current Outpatient Medications:    albuterol (VENTOLIN HFA) 108 (90 Base) MCG/ACT inhaler, Inhale 1 puff into the lungs every 6 (six) hours as needed for wheezing or shortness of breath., Disp: 18 g, Rfl: 1   aspirin 81 MG tablet, Take 81 mg by mouth daily., Disp: , Rfl:    Continuous Blood Gluc Transmit (DEXCOM G6 TRANSMITTER) MISC, Use to monitor blood sugar for 90 days, Disp: 1 each, Rfl: 11   cyclobenzaprine (FLEXERIL) 5 MG tablet, Take 1 tablet (5 mg total) by mouth 3 (three) times daily as needed for muscle spasms., Disp: 30 tablet, Rfl: 1   fluticasone (FLONASE) 50 MCG/ACT nasal spray, Place 2 sprays into both nostrils daily., Disp: 48 g, Rfl: 6   fluticasone-salmeterol (ADVAIR) 100-50 MCG/ACT AEPB, Inhale 1 puff into the lungs 2 (two) times daily., Disp: 1 each, Rfl: 3   Insulin Glargine (BASAGLAR KWIKPEN) 100 UNIT/ML, Inject 50 Units into the skin daily., Disp: 15 mL, Rfl: 11   Insulin Pen Needle 32G X 4 MM MISC, 1 pen by Does not apply route daily., Disp: 100 each, Rfl: 2   ipratropium-albuterol (DUONEB) 0.5-2.5 (3) MG/3ML SOLN, Take 3 mLs by nebulization every 6 (six) hours as needed.,  Disp: 360 mL, Rfl: 0   lisinopril-hydrochlorothiazide (ZESTORETIC) 20-12.5 MG tablet, Take 1 tablet by mouth daily., Disp: 90 tablet, Rfl: 3   loratadine (CLARITIN) 10 MG tablet, Take 1 tablet (10 mg total) by mouth daily., Disp: 90 tablet, Rfl: 3   metFORMIN (GLUCOPHAGE) 1000 MG tablet, TAKE 1 TABLET  (1,000 MG TOTAL) BY MOUTH 2 (TWO) TIMES A DAY WITH MEALS., Disp: 180 tablet, Rfl: 3   montelukast (SINGULAIR) 10 MG tablet, Take 1 tablet (10 mg total) by mouth at bedtime., Disp: 90 tablet, Rfl: 1   Multiple Vitamin (MULTIVITAMIN) capsule, Take 1 capsule by mouth daily., Disp: , Rfl:    simvastatin (ZOCOR) 40 MG tablet, Take 1 tablet by mouth once daily, Disp: 90 tablet, Rfl: 1   traZODone (DESYREL) 50 MG tablet, Take 1 tablet (50 mg total) by mouth at bedtime., Disp: 90 tablet, Rfl: 3  EXAM:  VITALS per patient if applicable:  GENERAL: alert, oriented, appears well and in no acute distress  HEENT: atraumatic, conjunttiva clear, no obvious abnormalities on inspection of external nose and ears  NECK: normal movements of the head and neck  LUNGS: on inspection no signs of respiratory distress, breathing rate appears normal, no obvious gross SOB, gasping or wheezing, she appears comfortable at the time of this visit  CV: no obvious cyanosis  MS: moves all visible extremities without noticeable abnormality  PSYCH/NEURO: pleasant and cooperative, no obvious depression or anxiety, speech and thought processing grossly intact  ASSESSMENT AND PLAN:  Discussed the following assessment and plan:  Cough, unspecified type  Exacerbation of persistent asthma, unspecified asthma severity  -we discussed possible serious and likely etiologies, options for evaluation and workup, limitations of telemedicine visit vs in person visit, treatment, treatment risks and precautions. Pt is agreeable to treatment via telemedicine at this moment. Query asthma exacerbation, viral illness vs possible bacterial resp infection given she reports lots of thick discolored sputum. She prefers to try empiric treatment with prednisone burst and doxy 100mg  bid x 7 days. Warned of risks with each. Discussed potential for elevated blood sugar. Albuterol 2 puffs prn q 4-6 hours. She agrees to do covid testing and advised can  contact [place] pharmacy if positive and desires antiviral.  Work/School slipped offered: provided in patient instructions   Advised to seek prompt virtual visit or in person care if worsening, new symptoms arise, or if is not improving with treatment as expected per our conversation of expected course. Discussed options for follow up care. Did let this patient know that I do telemedicine on Tuesdays and Thursdays for [place] and those are the days I am logged into the system. Advised to schedule follow up visit with PCP, [place] virtual visits or [facility] if any further questions or concerns to avoid delays in care.   I discussed the assessment and treatment plan with the patient. The patient was provided an opportunity to ask questions and all were answered. The patient agreed with the plan and demonstrated an understanding of the instructions.     06-23-1979, DO

## 2021-06-14 NOTE — Progress Notes (Signed)
[place]   The patient no-showed for appointment despite this provider sending direct link, reaching out via phone with no response and waiting for at least 10 minutes from appointment time for patient to join. They will be marked as a NS for this appointment/time.   Freddy Finner, NP

## 2021-06-30 ENCOUNTER — Other Ambulatory Visit: Payer: Self-pay | Admitting: Internal Medicine

## 2021-07-09 ENCOUNTER — Encounter: Payer: Self-pay | Admitting: Internal Medicine

## 2021-07-09 DIAGNOSIS — E118 Type 2 diabetes mellitus with unspecified complications: Secondary | ICD-10-CM

## 2021-07-10 MED ORDER — DEXCOM G6 SENSOR MISC: 2 refills | Status: DC

## 2021-07-24 IMAGING — DX DG CHEST 2V
2 series · 2 of 2 positions shown · non-contrast
Comparison: None.

CLINICAL DATA: Asthma and shortness of breath.

EXAM:
CHEST - 2 VIEW

[w chest pa]
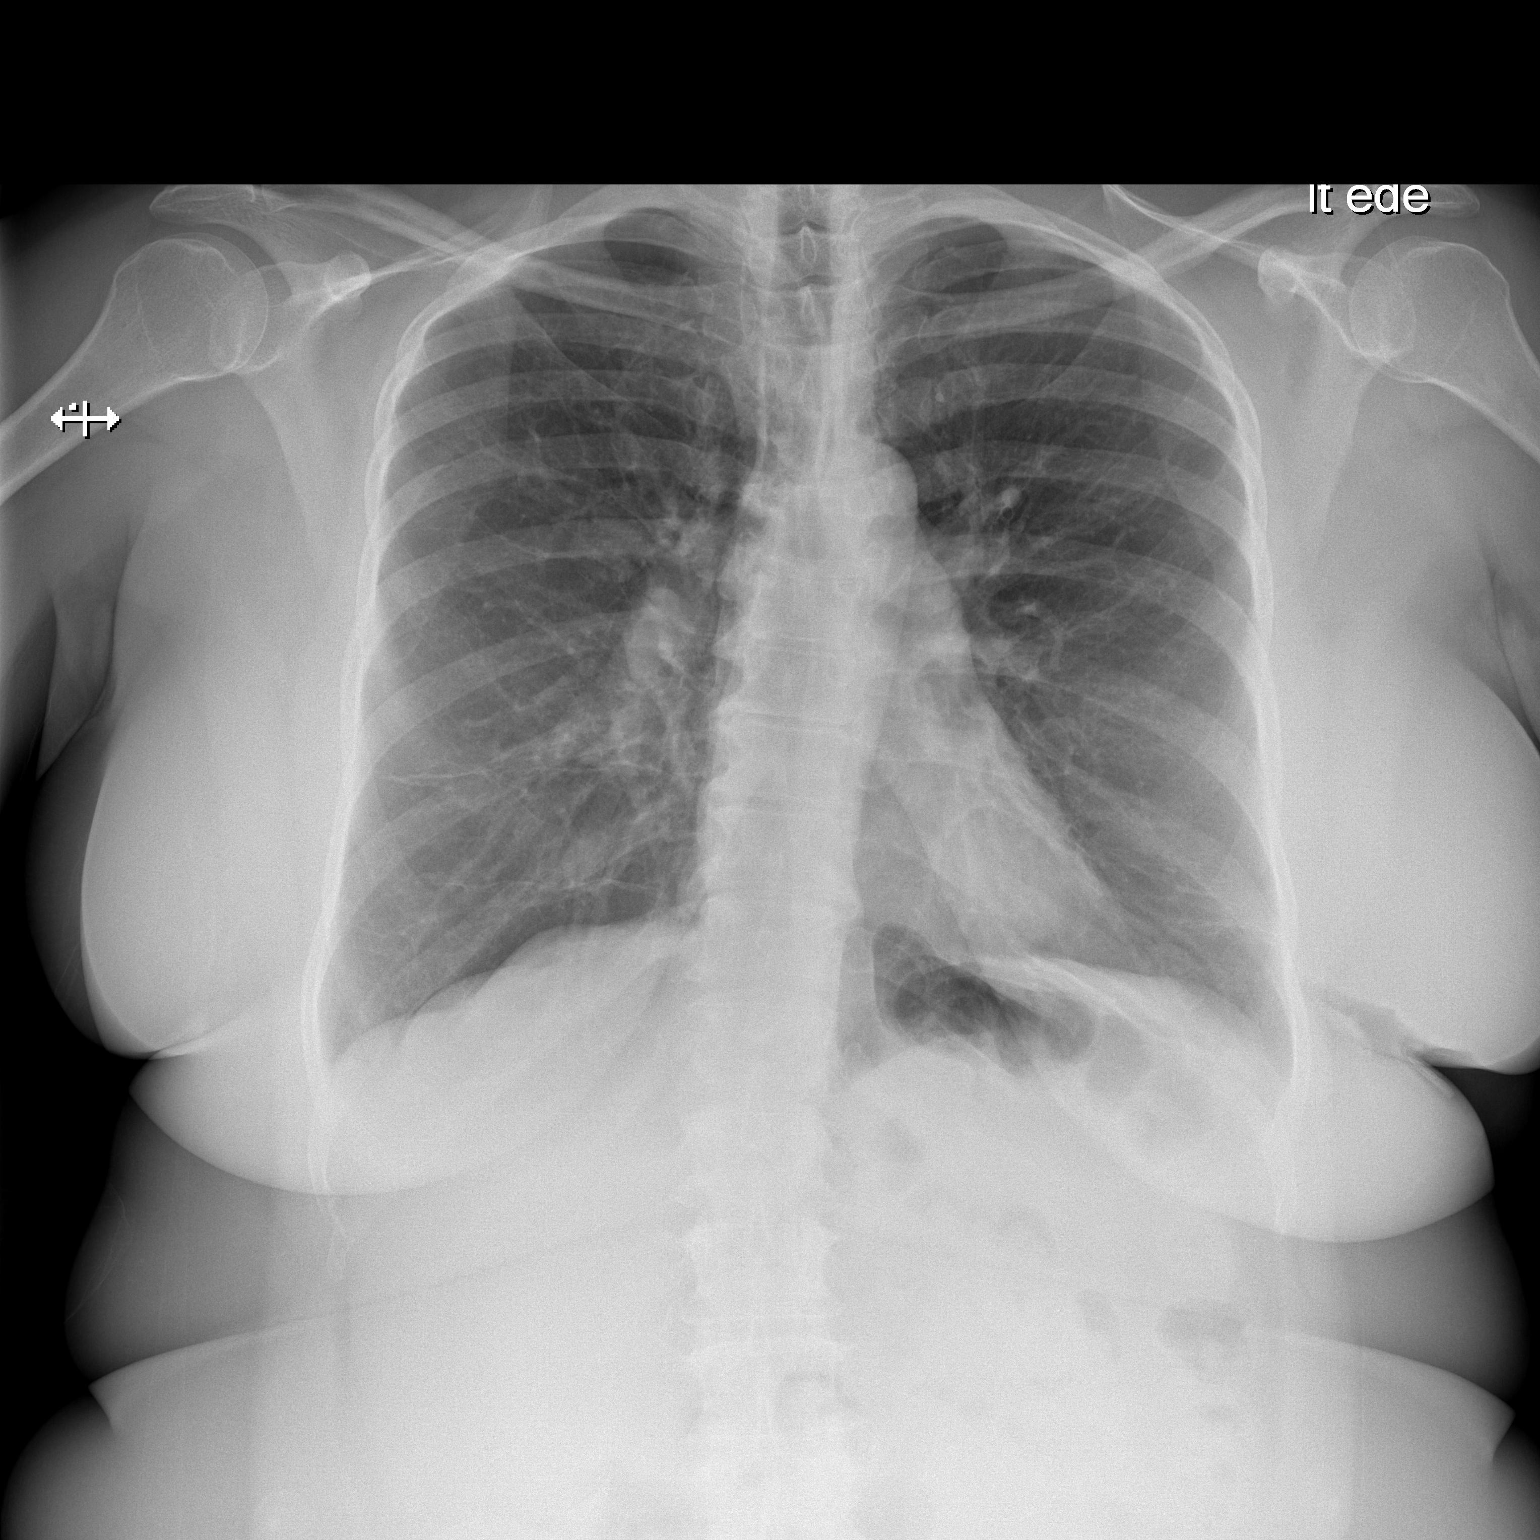

[w chest lat]
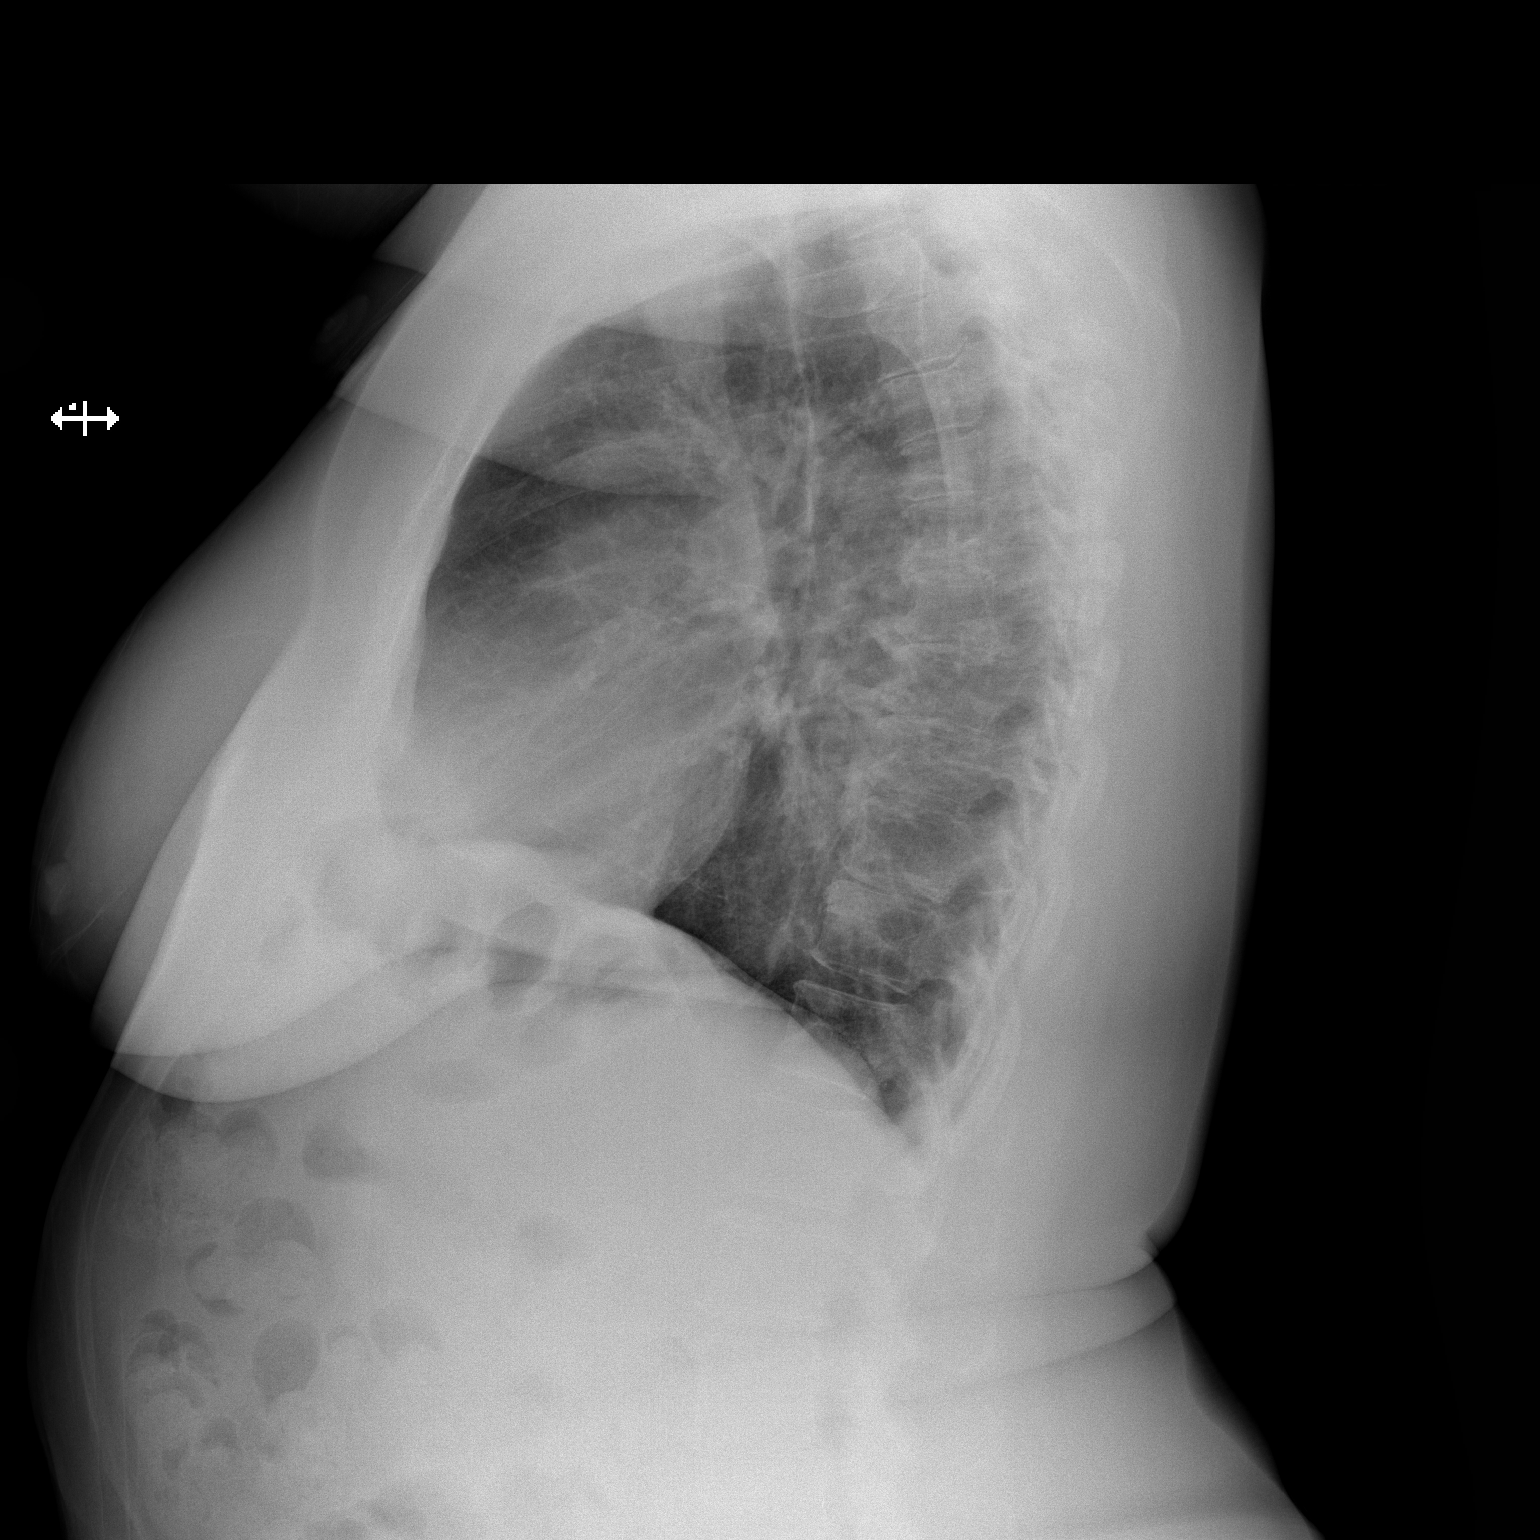

[2 of 2 positions shown; findings below may reference images not displayed]

FINDINGS: The heart size and mediastinal contours are within normal limits.
Mild bronchial thickening present predominantly in the perihilar
lungs bilaterally and lower lung zones. Although this may be
chronic, a component of acute bronchitis cannot be excluded. There
is no evidence of pulmonary edema, airspace consolidation,
pneumothorax, nodule or pleural fluid. The visualized skeletal
structures are unremarkable.
IMPRESSION: Mild bronchial thickening in the perihilar lungs and lower lung
zones. Although this may be chronic, a component of acute bronchitis
cannot be excluded.

## 2021-08-03 ENCOUNTER — Other Ambulatory Visit: Payer: Self-pay

## 2021-08-03 ENCOUNTER — Encounter: Payer: Self-pay | Admitting: Internal Medicine

## 2021-08-03 MED ORDER — ALBUTEROL SULFATE HFA 108 (90 BASE) MCG/ACT IN AERS: 1.0000 | INHALATION_SPRAY | Freq: Four times a day (QID) | RESPIRATORY_TRACT | 1 refills | Status: DC | PRN

## 2021-08-07 ENCOUNTER — Other Ambulatory Visit: Payer: Self-pay | Admitting: Internal Medicine

## 2021-08-23 ENCOUNTER — Ambulatory Visit: Payer: 59 | Admitting: Internal Medicine

## 2021-09-03 ENCOUNTER — Ambulatory Visit (INDEPENDENT_AMBULATORY_CARE_PROVIDER_SITE_OTHER): Payer: BC Managed Care – PPO | Admitting: Internal Medicine

## 2021-09-03 ENCOUNTER — Encounter: Payer: Self-pay | Admitting: Internal Medicine

## 2021-09-03 VITALS — BP 126/76 | HR 91 | Resp 15 | Ht 59.0 in | Wt 179.6 lb

## 2021-09-03 DIAGNOSIS — F3342 Major depressive disorder, recurrent, in full remission: Secondary | ICD-10-CM

## 2021-09-03 DIAGNOSIS — J4530 Mild persistent asthma, uncomplicated: Secondary | ICD-10-CM | POA: Diagnosis not present

## 2021-09-03 DIAGNOSIS — E118 Type 2 diabetes mellitus with unspecified complications: Secondary | ICD-10-CM | POA: Diagnosis not present

## 2021-09-03 LAB — POCT GLYCOSYLATED HEMOGLOBIN (HGB A1C): Hemoglobin A1C: 7.6 % — AB (ref 4.0–5.6)

## 2021-09-03 MED ORDER — FLUTICASONE-SALMETEROL 250-50 MCG/ACT IN AEPB: 1.0000 | INHALATION_SPRAY | Freq: Two times a day (BID) | RESPIRATORY_TRACT | 11 refills | Status: DC

## 2021-09-03 NOTE — Progress Notes (Signed)
   Subjective:   Patient ID: Destiny Trujillo, female    DOB: 09-Sep-1965, 56 y.o.   MRN: [id number]  Diabetes Pertinent negatives for diabetes include no chest pain.   The patient is a 56 YO female coming in for follow up.  Review of Systems  Constitutional: Negative.   HENT: Negative.    Eyes: Negative.   Respiratory:  Positive for shortness of breath. Negative for cough and chest tightness.   Cardiovascular:  Negative for chest pain, palpitations and leg swelling.  Gastrointestinal:  Negative for abdominal distention, abdominal pain, constipation, diarrhea, nausea and vomiting.  Musculoskeletal: Negative.   Skin: Negative.   Neurological: Negative.   Psychiatric/Behavioral: Negative.      Objective:  Physical Exam Constitutional:      Appearance: She is well-developed.  HENT:     Head: Normocephalic and atraumatic.  Cardiovascular:     Rate and Rhythm: Normal rate and regular rhythm.  Pulmonary:     Effort: Pulmonary effort is normal. No respiratory distress.     Breath sounds: Normal breath sounds. No wheezing or rales.  Abdominal:     General: Bowel sounds are normal. There is no distension.     Palpations: Abdomen is soft.     Tenderness: There is no abdominal tenderness. There is no rebound.  Musculoskeletal:     Cervical back: Normal range of motion.  Skin:    General: Skin is warm and dry.  Neurological:     Mental Status: She is alert and oriented to person, place, and time.     Coordination: Coordination normal.     Vitals:   09/03/21 0819  BP: 126/76  Pulse: 91  Resp: 15  SpO2: 95%  Weight: 179 lb 9.6 oz (81.5 kg)  Height: 4\' 11"  (1.499 m)    Assessment & Plan:

## 2021-09-03 NOTE — Assessment & Plan Note (Signed)
Well controlled off medications for now. PHQ-9 0.

## 2021-09-03 NOTE — Patient Instructions (Addendum)
We have sent in the stronger advair to use. Until you run out use 2 puffs twice a day of the advair you have at home.  Your HgA1c is 7.6 which is good.

## 2021-09-03 NOTE — Assessment & Plan Note (Signed)
Weight is decreased from last fall and she is working on further weight loss. Counseled about diet and working on breathing so she can move more.

## 2021-09-03 NOTE — Assessment & Plan Note (Signed)
HgA1c checked today at 7.6 which is close to goal of 7.5. She is working on weight loss and will continue. We will have her keep using dexcom. Continue basaglar 50 units daily and metformin 1000 mg BID. She is on ACE-I and statin. Getting records of eye exam.

## 2021-09-03 NOTE — Assessment & Plan Note (Signed)
Worsening due to air quality issues and using nebulizer daily and albuterol inhaler as well. We will increase the dose of advair from 100/50 to 250/50. She is asked to do 2 puffs of her old advair until gone BID (the 100/50). When she gets new inhaler use 1 puff BID. Let us know if no improvement.

## 2021-09-04 ENCOUNTER — Encounter: Payer: Self-pay | Admitting: Internal Medicine

## 2021-09-04 NOTE — Telephone Encounter (Signed)
Need PA on dexcom.Marland KitchenRaechel Chute

## 2021-10-14 ENCOUNTER — Other Ambulatory Visit: Payer: Self-pay | Admitting: Internal Medicine

## 2021-11-21 ENCOUNTER — Other Ambulatory Visit: Payer: Self-pay | Admitting: Internal Medicine

## 2021-11-21 ENCOUNTER — Encounter: Payer: Self-pay | Admitting: Internal Medicine

## 2021-11-22 ENCOUNTER — Other Ambulatory Visit: Payer: Self-pay

## 2021-11-22 MED ORDER — MONTELUKAST SODIUM 10 MG PO TABS
10.0000 mg | ORAL_TABLET | Freq: Every day | ORAL | 1 refills | Status: DC
  Filled 2022-02-27: qty 90, 90d supply, fill #0

## 2021-12-18 LAB — HM DIABETES EYE EXAM

## 2021-12-20 NOTE — Progress Notes (Signed)
[facility]- [facility]

## 2021-12-26 ENCOUNTER — Other Ambulatory Visit: Payer: Self-pay | Admitting: Internal Medicine

## 2022-01-01 ENCOUNTER — Encounter: Payer: Self-pay | Admitting: Internal Medicine

## 2022-01-01 ENCOUNTER — Ambulatory Visit (INDEPENDENT_AMBULATORY_CARE_PROVIDER_SITE_OTHER): Payer: BC Managed Care – PPO | Admitting: Internal Medicine

## 2022-01-01 VITALS — BP 118/80 | HR 82 | Temp 98.7°F | Ht 59.0 in | Wt 170.0 lb

## 2022-01-01 DIAGNOSIS — E785 Hyperlipidemia, unspecified: Secondary | ICD-10-CM | POA: Diagnosis not present

## 2022-01-01 DIAGNOSIS — E1169 Type 2 diabetes mellitus with other specified complication: Secondary | ICD-10-CM | POA: Diagnosis not present

## 2022-01-01 DIAGNOSIS — R1084 Generalized abdominal pain: Secondary | ICD-10-CM | POA: Diagnosis not present

## 2022-01-01 DIAGNOSIS — E118 Type 2 diabetes mellitus with unspecified complications: Secondary | ICD-10-CM | POA: Diagnosis not present

## 2022-01-01 LAB — LIPID PANEL
Cholesterol: 140 mg/dL (ref 0–200)
HDL: 49.9 mg/dL (ref >39.00)
LDL Cholesterol: 62 mg/dL (ref 0–99)
NonHDL: 90.57
Total CHOL/HDL Ratio: 3
Triglycerides: 142 mg/dL (ref 0.0–149.0)
VLDL: 28.4 mg/dL (ref 0.0–40.0)

## 2022-01-01 LAB — COMPREHENSIVE METABOLIC PANEL
ALT: 16 U/L (ref 0–35)
AST: 13 U/L (ref 0–37)
Albumin: 4.7 g/dL (ref 3.5–5.2)
Alkaline Phosphatase: 84 U/L (ref 39–117)
BUN: 7 mg/dL (ref 6–23)
CO2: 31 mEq/L (ref 19–32)
Calcium: 10.1 mg/dL (ref 8.4–10.5)
Chloride: 99 mEq/L (ref 96–112)
Creatinine, Ser: 0.79 mg/dL (ref 0.40–1.20)
GFR: 83.06 mL/min (ref >60.00)
Glucose, Bld: 154 mg/dL — ABNORMAL HIGH (ref 70–99)
Potassium: 3.3 mEq/L — ABNORMAL LOW (ref 3.5–5.1)
Sodium: 138 mEq/L (ref 135–145)
Total Bilirubin: 0.3 mg/dL (ref 0.2–1.2)
Total Protein: 8.1 g/dL (ref 6.0–8.3)

## 2022-01-01 LAB — CBC
HCT: 39 % (ref 36.0–46.0)
Hemoglobin: 13.2 g/dL (ref 12.0–15.0)
MCHC: 33.9 g/dL (ref 30.0–36.0)
MCV: 78.1 fl (ref 78.0–100.0)
Platelets: 337 10*3/uL (ref 150.0–400.0)
RBC: 5 Mil/uL (ref 3.87–5.11)
RDW: 15.3 % (ref 11.5–15.5)
WBC: 8.5 10*3/uL (ref 4.0–10.5)

## 2022-01-01 LAB — HEMOGLOBIN A1C: Hgb A1c MFr Bld: 7.5 % — ABNORMAL HIGH (ref 4.6–6.5)

## 2022-01-01 LAB — MICROALBUMIN / CREATININE URINE RATIO
Creatinine,U: 112.2 mg/dL
Microalb Creat Ratio: 0.6 mg/g (ref 0.0–30.0)
Microalb, Ur: <0.7 mg/dL (ref 0.0–1.9)

## 2022-01-01 MED ORDER — ONDANSETRON HCL 4 MG PO TABS: 4.0000 mg | ORAL_TABLET | Freq: Three times a day (TID) | ORAL | 0 refills | Status: DC | PRN

## 2022-01-01 MED ORDER — PANTOPRAZOLE SODIUM 40 MG PO TBEC: 40.0000 mg | DELAYED_RELEASE_TABLET | Freq: Every day | ORAL | 3 refills | Status: DC

## 2022-01-01 NOTE — Progress Notes (Signed)
   Subjective:   Patient ID: Destiny Trujillo, female    DOB: May 14, 1965, 56 y.o.   MRN: [id number]  HPI The patient is a 56 YO female coming in for stomach problems and nausea.   Review of Systems  Constitutional: Negative.   HENT: Negative.    Eyes: Negative.   Respiratory:  Negative for cough, chest tightness and shortness of breath.   Cardiovascular:  Negative for chest pain, palpitations and leg swelling.  Gastrointestinal:  Positive for abdominal pain, nausea and vomiting. Negative for abdominal distention, constipation and diarrhea.  Musculoskeletal: Negative.   Skin: Negative.   Neurological: Negative.   Psychiatric/Behavioral: Negative.      Objective:  Physical Exam Constitutional:      Appearance: She is well-developed. She is obese.  HENT:     Head: Normocephalic and atraumatic.  Cardiovascular:     Rate and Rhythm: Normal rate and regular rhythm.  Pulmonary:     Effort: Pulmonary effort is normal. No respiratory distress.     Breath sounds: Normal breath sounds. No wheezing or rales.  Abdominal:     General: Bowel sounds are normal. There is no distension.     Palpations: Abdomen is soft.     Tenderness: There is abdominal tenderness. There is no rebound.     Comments: Mild diffuse tenderness  Musculoskeletal:     Cervical back: Normal range of motion.  Skin:    General: Skin is warm and dry.  Neurological:     Mental Status: She is alert and oriented to person, place, and time.     Coordination: Coordination normal.     Vitals:   01/01/22 1534  BP: 118/80  Pulse: 82  Temp: 98.7 F (37.1 C)  TempSrc: Oral  SpO2: 97%  Weight: 170 lb (77.1 kg)  Height: 4\' 11"  (1.499 m)    Assessment & Plan:

## 2022-01-01 NOTE — Patient Instructions (Signed)
We have sent in protonix to take 1 pill daily before breakfast. Take this for 1-2 weeks.  We have sent in zofran to use for nausea as needed.

## 2022-01-04 DIAGNOSIS — R109 Unspecified abdominal pain: Secondary | ICD-10-CM | POA: Insufficient documentation

## 2022-01-04 NOTE — Assessment & Plan Note (Signed)
Suspect GERD related given symptoms change with eating. She is prescribed protonix 40 mg daily and zofran 4 mg prn. Checking CMP and CBC today to exclude alternate etiology. If no improvement will need CT abdomen/pelvis.

## 2022-01-04 NOTE — Assessment & Plan Note (Signed)
Checking lipid panel and adjust simvastatin 20 mg daily as needed for LDL<100. 

## 2022-01-04 NOTE — Assessment & Plan Note (Signed)
Needs follow up diabetes to ensure poor diabetes control is not causing abdominal symptoms. Foot exam done. Checking HgA1c, microalbumin to creatinine ratio and CMP and lipid panel. Adjust as needed her metformin 1000 mg BID and basaglar 40 unit daily. Is on statin and ACE-I.

## 2022-01-28 ENCOUNTER — Other Ambulatory Visit: Payer: Self-pay | Admitting: Internal Medicine

## 2022-01-28 ENCOUNTER — Encounter: Payer: Self-pay | Admitting: Internal Medicine

## 2022-01-30 ENCOUNTER — Other Ambulatory Visit: Payer: Self-pay

## 2022-01-30 MED ORDER — METFORMIN HCL 1000 MG PO TABS: ORAL_TABLET | ORAL | 3 refills | Status: DC

## 2022-01-31 ENCOUNTER — Other Ambulatory Visit: Payer: Self-pay | Admitting: Internal Medicine

## 2022-02-01 ENCOUNTER — Other Ambulatory Visit: Payer: Self-pay

## 2022-02-01 MED ORDER — IPRATROPIUM-ALBUTEROL 0.5-2.5 (3) MG/3ML IN SOLN
3.0000 mL | Freq: Four times a day (QID) | RESPIRATORY_TRACT | 0 refills | Status: DC | PRN
  Filled 2022-02-01: qty 360, 28d supply, fill #0

## 2022-02-05 ENCOUNTER — Other Ambulatory Visit: Payer: Self-pay

## 2022-02-06 ENCOUNTER — Other Ambulatory Visit: Payer: Self-pay

## 2022-02-06 MED FILL — Albuterol Sulfate Inhal Aero 108 MCG/ACT (90MCG Base Equiv): RESPIRATORY_TRACT | 25 days supply | Qty: 6.7 | Fill #0 | Status: AC

## 2022-02-27 ENCOUNTER — Other Ambulatory Visit: Payer: Self-pay

## 2022-02-27 MED FILL — Lisinopril & Hydrochlorothiazide Tab 20-12.5 MG: ORAL | 90 days supply | Qty: 90 | Fill #0 | Status: CN

## 2022-03-01 ENCOUNTER — Other Ambulatory Visit: Payer: Self-pay

## 2022-03-05 ENCOUNTER — Other Ambulatory Visit: Payer: Self-pay

## 2022-03-06 ENCOUNTER — Ambulatory Visit: Payer: 59 | Admitting: Internal Medicine

## 2022-03-19 ENCOUNTER — Other Ambulatory Visit: Payer: Self-pay

## 2022-03-27 ENCOUNTER — Ambulatory Visit: Payer: 59 | Admitting: Internal Medicine

## 2022-03-27 DIAGNOSIS — I10 Essential (primary) hypertension: Secondary | ICD-10-CM

## 2022-03-27 DIAGNOSIS — E118 Type 2 diabetes mellitus with unspecified complications: Secondary | ICD-10-CM

## 2022-03-27 DIAGNOSIS — E785 Hyperlipidemia, unspecified: Secondary | ICD-10-CM

## 2022-04-05 ENCOUNTER — Other Ambulatory Visit: Payer: Self-pay

## 2022-04-05 ENCOUNTER — Ambulatory Visit (INDEPENDENT_AMBULATORY_CARE_PROVIDER_SITE_OTHER): Payer: PRIVATE HEALTH INSURANCE | Admitting: Internal Medicine

## 2022-04-05 ENCOUNTER — Encounter: Payer: Self-pay | Admitting: Internal Medicine

## 2022-04-05 ENCOUNTER — Ambulatory Visit: Payer: PRIVATE HEALTH INSURANCE | Admitting: Internal Medicine

## 2022-04-05 VITALS — BP 118/70 | HR 70 | Temp 98.3°F | Ht 59.0 in | Wt 171.8 lb

## 2022-04-05 DIAGNOSIS — E118 Type 2 diabetes mellitus with unspecified complications: Secondary | ICD-10-CM | POA: Diagnosis not present

## 2022-04-05 LAB — POCT GLYCOSYLATED HEMOGLOBIN (HGB A1C): Hemoglobin A1C: 7.3 % — AB (ref 4.0–5.6)

## 2022-04-05 MED ORDER — DEXCOM G6 SENSOR MISC
11 refills | Status: DC
  Filled 2022-04-05: qty 6, 30d supply, fill #0
  Filled 2022-05-01: qty 3, 30d supply, fill #0

## 2022-04-05 MED ORDER — DEXCOM G6 SENSOR MISC: 11 refills | Status: DC

## 2022-04-05 NOTE — Assessment & Plan Note (Signed)
POC HgA1c 7.3 today which is at goal of <7.5%. Continue basaglar 40 units daily and metformin 1000 mg bid. Is on statin and ACE-I. Using CGM monitoring to effectively manage diet much better.

## 2022-04-05 NOTE — Patient Instructions (Signed)
Your HgA1c is 7.3 today

## 2022-04-05 NOTE — Progress Notes (Signed)
   Subjective:   Patient ID: Destiny Trujillo, female    DOB: 06/21/1965, 57 y.o.   MRN: [id number]  HPI The patient is a 57 YO female coming in for follow up.  Review of Systems  Constitutional: Negative.   HENT: Negative.    Eyes: Negative.   Respiratory:  Negative for cough, chest tightness and shortness of breath.   Cardiovascular:  Negative for chest pain, palpitations and leg swelling.  Gastrointestinal:  Negative for abdominal distention, abdominal pain, constipation, diarrhea, nausea and vomiting.  Musculoskeletal: Negative.   Skin: Negative.   Neurological: Negative.   Psychiatric/Behavioral: Negative.      Objective:  Physical Exam Constitutional:      Appearance: She is well-developed.  HENT:     Head: Normocephalic and atraumatic.  Cardiovascular:     Rate and Rhythm: Normal rate and regular rhythm.  Pulmonary:     Effort: Pulmonary effort is normal. No respiratory distress.     Breath sounds: Normal breath sounds. No wheezing or rales.  Abdominal:     General: Bowel sounds are normal. There is no distension.     Palpations: Abdomen is soft.     Tenderness: There is no abdominal tenderness. There is no rebound.  Musculoskeletal:     Cervical back: Normal range of motion.  Skin:    General: Skin is warm and dry.  Neurological:     Mental Status: She is alert and oriented to person, place, and time.     Coordination: Coordination normal.     Vitals:   04/05/22 0930  BP: 118/70  Pulse: 70  Temp: 98.3 F (36.8 C)  TempSrc: Oral  SpO2: 96%  Weight: 171 lb 12.8 oz (77.9 kg)  Height: 4\' 11"  (1.499 m)    Assessment & Plan:

## 2022-04-15 ENCOUNTER — Telehealth: Payer: Self-pay

## 2022-04-15 NOTE — Transitions of Care (Post Inpatient/ED Visit) (Unsigned)
   04/15/2022  Name: Destiny Trujillo MRN: [id number] DOB: [birth year]  Today's TOC FU Call Status: Today's TOC FU Call Status:: Unsuccessul Call (1st Attempt) Unsuccessful Call (1st Attempt) Date: 04/15/22  Attempted to reach the patient regarding the most recent Inpatient/ED visit.  Follow Up Plan: Additional outreach attempts will be made to reach the patient to complete the Transitions of Care (Post Inpatient/ED visit) call.   Signature Juanda Crumble, [place] Direct Dial [phone]

## 2022-04-16 NOTE — Transitions of Care (Post Inpatient/ED Visit) (Signed)
   04/16/2022  Name: Destiny Trujillo MRN: [id number] DOB: November 26, 1965  Today's TOC FU Call Status: Today's TOC FU Call Status:: Unsuccessful Call (2nd Attempt) Unsuccessful Call (1st Attempt) Date: 04/15/22 Unsuccessful Call (2nd Attempt) Date: 04/16/22  Attempted to reach the patient regarding the most recent Inpatient/ED visit.  Follow Up Plan: No further outreach attempts will be made at this time. We have been unable to contact the patient.  Signature Juanda Crumble, [place] Direct Dial [phone]

## 2022-04-29 MED FILL — Simvastatin Tab 40 MG: ORAL | 90 days supply | Qty: 90 | Fill #0 | Status: AC

## 2022-04-30 ENCOUNTER — Ambulatory Visit (INDEPENDENT_AMBULATORY_CARE_PROVIDER_SITE_OTHER): Payer: BC Managed Care – PPO | Admitting: Internal Medicine

## 2022-04-30 ENCOUNTER — Other Ambulatory Visit: Payer: Self-pay

## 2022-04-30 ENCOUNTER — Encounter: Payer: Self-pay | Admitting: Internal Medicine

## 2022-04-30 VITALS — BP 118/80 | HR 82 | Temp 98.6°F | Ht 59.0 in | Wt 175.5 lb

## 2022-04-30 DIAGNOSIS — G44209 Tension-type headache, unspecified, not intractable: Secondary | ICD-10-CM

## 2022-04-30 DIAGNOSIS — R519 Headache, unspecified: Secondary | ICD-10-CM | POA: Insufficient documentation

## 2022-04-30 NOTE — Progress Notes (Signed)
   Subjective:   Patient ID: Destiny Trujillo, female    DOB: [birth year], 57 y.o.   MRN: [id number]  HPI The patient is a 57 YO female coming in for headaches. Some mild neck pain and some sinus symptoms.   Review of Systems  Constitutional: Negative.   HENT: Negative.    Eyes: Negative.   Respiratory:  Negative for cough, chest tightness and shortness of breath.   Cardiovascular:  Negative for chest pain, palpitations and leg swelling.  Gastrointestinal:  Negative for abdominal distention, abdominal pain, constipation, diarrhea, nausea and vomiting.  Musculoskeletal: Negative.   Skin: Negative.   Neurological:  Positive for headaches.  Psychiatric/Behavioral: Negative.      Objective:  Physical Exam Constitutional:      Appearance: She is well-developed.  HENT:     Head: Normocephalic and atraumatic.  Cardiovascular:     Rate and Rhythm: Normal rate and regular rhythm.  Pulmonary:     Effort: Pulmonary effort is normal. No respiratory distress.     Breath sounds: Normal breath sounds. No wheezing or rales.  Abdominal:     General: Bowel sounds are normal. There is no distension.     Palpations: Abdomen is soft.     Tenderness: There is no abdominal tenderness. There is no rebound.  Musculoskeletal:     Cervical back: Normal range of motion.  Skin:    General: Skin is warm and dry.  Neurological:     Mental Status: She is alert and oriented to person, place, and time.     Coordination: Coordination normal.     Vitals:   04/30/22 1309  BP: 118/80  Pulse: 82  Temp: 98.6 F (37 C)  TempSrc: Oral  SpO2: 98%  Weight: 175 lb 8 oz (79.6 kg)  Height: 4\' 11"  (1.499 m)    Assessment & Plan:  Visit time 15 minutes in face to face communication with patient and coordination of care, additional 5 minutes spent in record review, coordination or care, ordering tests, communicating/referring to other healthcare professionals, documenting in medical records all on the  same day of the visit for total time 20 minutes spent on the visit.

## 2022-04-30 NOTE — Assessment & Plan Note (Signed)
Started in the last 4-5 days with daily headache. Having some neck pain symptoms and some allergy symptoms. Either could be the cause. She is using tylenol with relief. BP is normal. Advised to continue otc and let us know in 1-2 weeks if not resolving.

## 2022-04-30 NOTE — Patient Instructions (Signed)
Let us know if this does not improve and we can send in some prednisone to help.

## 2022-05-01 ENCOUNTER — Encounter: Payer: Self-pay | Admitting: Internal Medicine

## 2022-05-01 ENCOUNTER — Other Ambulatory Visit: Payer: Self-pay

## 2022-05-01 MED ORDER — DEXCOM G6 RECEIVER DEVI
0 refills | Status: DC
  Filled 2022-05-01: qty 1, 30d supply, fill #0

## 2022-05-02 ENCOUNTER — Other Ambulatory Visit: Payer: Self-pay

## 2022-05-02 MED ORDER — PREDNISONE 20 MG PO TABS
40.0000 mg | ORAL_TABLET | Freq: Every day | ORAL | 0 refills | Status: DC
  Filled 2022-05-02: qty 10, 5d supply, fill #0

## 2022-05-02 NOTE — Addendum Note (Signed)
Addended by: Hillard Danker A on: 05/02/2022 09:11 AM   Modules accepted: Orders

## 2022-05-06 ENCOUNTER — Other Ambulatory Visit: Payer: Self-pay

## 2022-05-14 ENCOUNTER — Other Ambulatory Visit: Payer: Self-pay

## 2022-05-14 MED FILL — Insulin Glargine Soln Pen-Injector 100 Unit/ML: SUBCUTANEOUS | 37 days supply | Qty: 15 | Fill #0 | Status: CN

## 2022-05-15 ENCOUNTER — Other Ambulatory Visit: Payer: Self-pay

## 2022-05-17 ENCOUNTER — Other Ambulatory Visit: Payer: Self-pay

## 2022-05-30 ENCOUNTER — Encounter: Payer: Self-pay | Admitting: Internal Medicine

## 2022-05-30 ENCOUNTER — Other Ambulatory Visit: Payer: Self-pay

## 2022-05-30 MED ORDER — METFORMIN HCL 1000 MG PO TABS: ORAL_TABLET | ORAL | 3 refills | Status: DC

## 2022-05-31 ENCOUNTER — Other Ambulatory Visit: Payer: Self-pay

## 2022-07-31 ENCOUNTER — Other Ambulatory Visit: Payer: Self-pay | Admitting: Internal Medicine

## 2022-09-05 ENCOUNTER — Encounter (INDEPENDENT_AMBULATORY_CARE_PROVIDER_SITE_OTHER): Payer: Self-pay

## 2022-09-09 ENCOUNTER — Other Ambulatory Visit: Payer: Self-pay | Admitting: Internal Medicine

## 2022-09-24 ENCOUNTER — Encounter: Payer: Self-pay | Admitting: Internal Medicine

## 2022-09-25 ENCOUNTER — Other Ambulatory Visit: Payer: Self-pay

## 2022-09-25 DIAGNOSIS — I10 Essential (primary) hypertension: Secondary | ICD-10-CM

## 2022-09-25 MED ORDER — METFORMIN HCL 1000 MG PO TABS
ORAL_TABLET | ORAL | 3 refills | Status: DC
Start: 2022-09-25 — End: 2023-07-31

## 2022-09-27 ENCOUNTER — Ambulatory Visit: Payer: 59 | Admitting: Internal Medicine

## 2022-10-03 ENCOUNTER — Other Ambulatory Visit: Payer: Self-pay | Admitting: Internal Medicine

## 2022-10-21 ENCOUNTER — Ambulatory Visit: Payer: 59 | Admitting: Internal Medicine

## 2022-10-30 ENCOUNTER — Other Ambulatory Visit: Payer: Self-pay | Admitting: Internal Medicine

## 2022-11-04 ENCOUNTER — Ambulatory Visit: Payer: 59 | Admitting: Internal Medicine

## 2022-11-15 ENCOUNTER — Other Ambulatory Visit: Payer: Self-pay | Admitting: Internal Medicine

## 2022-11-18 ENCOUNTER — Ambulatory Visit: Payer: 59 | Admitting: Internal Medicine

## 2022-11-24 ENCOUNTER — Other Ambulatory Visit: Payer: Self-pay | Admitting: Internal Medicine

## 2022-11-28 ENCOUNTER — Encounter: Payer: Self-pay | Admitting: Internal Medicine

## 2022-11-28 ENCOUNTER — Ambulatory Visit (INDEPENDENT_AMBULATORY_CARE_PROVIDER_SITE_OTHER): Payer: BC Managed Care – PPO | Admitting: Internal Medicine

## 2022-11-28 VITALS — BP 120/76 | HR 87 | Temp 98.5°F | Ht 59.0 in | Wt 174.5 lb

## 2022-11-28 DIAGNOSIS — E1169 Type 2 diabetes mellitus with other specified complication: Secondary | ICD-10-CM

## 2022-11-28 DIAGNOSIS — Z0000 Encounter for general adult medical examination without abnormal findings: Secondary | ICD-10-CM

## 2022-11-28 DIAGNOSIS — F19982 Other psychoactive substance use, unspecified with psychoactive substance-induced sleep disorder: Secondary | ICD-10-CM

## 2022-11-28 DIAGNOSIS — Z7984 Long term (current) use of oral hypoglycemic drugs: Secondary | ICD-10-CM | POA: Diagnosis not present

## 2022-11-28 DIAGNOSIS — E118 Type 2 diabetes mellitus with unspecified complications: Secondary | ICD-10-CM | POA: Diagnosis not present

## 2022-11-28 DIAGNOSIS — J4530 Mild persistent asthma, uncomplicated: Secondary | ICD-10-CM

## 2022-11-28 DIAGNOSIS — Z794 Long term (current) use of insulin: Secondary | ICD-10-CM

## 2022-11-28 DIAGNOSIS — E785 Hyperlipidemia, unspecified: Secondary | ICD-10-CM

## 2022-11-28 DIAGNOSIS — I10 Essential (primary) hypertension: Secondary | ICD-10-CM

## 2022-11-28 LAB — COMPREHENSIVE METABOLIC PANEL
ALT: 13 U/L (ref 0–35)
AST: 16 U/L (ref 0–37)
Albumin: 4.4 g/dL (ref 3.5–5.2)
Alkaline Phosphatase: 85 U/L (ref 39–117)
BUN: 16 mg/dL (ref 6–23)
CO2: 30 meq/L (ref 19–32)
Calcium: 10 mg/dL (ref 8.4–10.5)
Chloride: 101 meq/L (ref 96–112)
Creatinine, Ser: 0.9 mg/dL (ref 0.40–1.20)
GFR: 70.58 mL/min (ref >60.00)
Glucose, Bld: 113 mg/dL — ABNORMAL HIGH (ref 70–99)
Potassium: 4 meq/L (ref 3.5–5.1)
Sodium: 140 meq/L (ref 135–145)
Total Bilirubin: 0.3 mg/dL (ref 0.2–1.2)
Total Protein: 7.7 g/dL (ref 6.0–8.3)

## 2022-11-28 LAB — CBC
HCT: 39.4 % (ref 36.0–46.0)
Hemoglobin: 13 g/dL (ref 12.0–15.0)
MCHC: 32.9 g/dL (ref 30.0–36.0)
MCV: 80.6 fL (ref 78.0–100.0)
Platelets: 320 10*3/uL (ref 150.0–400.0)
RBC: 4.89 Mil/uL (ref 3.87–5.11)
RDW: 14.8 % (ref 11.5–15.5)
WBC: 7.5 10*3/uL (ref 4.0–10.5)

## 2022-11-28 LAB — LIPID PANEL
Cholesterol: 144 mg/dL (ref 0–200)
HDL: 55.1 mg/dL (ref >39.00)
LDL Cholesterol: 63 mg/dL (ref 0–99)
NonHDL: 88.83
Total CHOL/HDL Ratio: 3
Triglycerides: 131 mg/dL (ref 0.0–149.0)
VLDL: 26.2 mg/dL (ref 0.0–40.0)

## 2022-11-28 LAB — HEMOGLOBIN A1C: Hgb A1c MFr Bld: 7.1 % — ABNORMAL HIGH (ref 4.6–6.5)

## 2022-11-28 LAB — MICROALBUMIN / CREATININE URINE RATIO
Creatinine,U: 69.9 mg/dL
Microalb Creat Ratio: 1 mg/g (ref 0.0–30.0)
Microalb, Ur: <0.7 mg/dL (ref 0.0–1.9)

## 2022-11-28 MED ORDER — DEXCOM G7 RECEIVER DEVI: 0 refills | Status: DC

## 2022-11-28 MED ORDER — DEXCOM G7 SENSOR MISC: 6 refills | Status: DC

## 2022-11-28 NOTE — Assessment & Plan Note (Signed)
Flu shot complete for season. Shingrix declines. Tetanus up to date. Colonoscopy declines today. Mammogram up to date, pap smear up to date need records. Counseled about sun safety and mole surveillance. Counseled about the dangers of distracted driving. Given 10 year screening recommendations.

## 2022-11-28 NOTE — Assessment & Plan Note (Signed)
BP at goal and will check CMP. Adjust as needed lisinopril/hydrochlorothiazide 20/12.5 mg daily.

## 2022-11-28 NOTE — Assessment & Plan Note (Signed)
Taking trazodone rarely and can refill if needed.

## 2022-11-28 NOTE — Progress Notes (Signed)
   Subjective:   Patient ID: Destiny Trujillo, female    DOB: Dec 03, 1965, 57 y.o.   MRN: [id number]  HPI The patient is here for physical.  PMH, [facility], social history reviewed and updated  Review of Systems  Constitutional: Negative.   HENT: Negative.    Eyes: Negative.   Respiratory:  Negative for cough, chest tightness and shortness of breath.   Cardiovascular:  Negative for palpitations and leg swelling.  Gastrointestinal:  Negative for abdominal distention, abdominal pain, constipation, diarrhea, nausea and vomiting.  Musculoskeletal: Negative.   Skin: Negative.   Neurological: Negative.   Psychiatric/Behavioral: Negative.      Objective:  Physical Exam Constitutional:      Appearance: She is well-developed.  HENT:     Head: Normocephalic and atraumatic.  Cardiovascular:     Rate and Rhythm: Normal rate and regular rhythm.  Pulmonary:     Effort: Pulmonary effort is normal. No respiratory distress.     Breath sounds: Normal breath sounds. No wheezing or rales.  Abdominal:     General: Bowel sounds are normal. There is no distension.     Palpations: Abdomen is soft.     Tenderness: There is no abdominal tenderness. There is no rebound.  Musculoskeletal:     Cervical back: Normal range of motion.  Skin:    General: Skin is warm and dry.  Neurological:     Mental Status: She is alert and oriented to person, place, and time.     Coordination: Coordination normal.     Vitals:   11/28/22 0935  BP: 120/76  Pulse: 87  Temp: 98.5 F (36.9 C)  TempSrc: Oral  SpO2: 97%  Weight: 174 lb 8 oz (79.2 kg)  Height: 4\' 11"  (1.499 m)    Assessment & Plan:

## 2022-11-28 NOTE — Assessment & Plan Note (Signed)
Checking HGA1c, lipid panel, microalbumin to creatinine ratio, CMP. Foot exam done and eye exam up to date. Taking lantus 40 units daily and metformin 1000 mg BID. Is on ACE-I and statin. Adjust as needed. She is having bleeding with G6 sensors and wishes to switch to G7 which is prescribed today.

## 2022-11-28 NOTE — Assessment & Plan Note (Addendum)
No flare today and is using albuterol prn and singulair and claritin and advair 250/50. Mild persistent without complication.

## 2022-11-28 NOTE — Assessment & Plan Note (Signed)
Checking lipid panel and adjust simvastatin 40 mg daily as needed for LDL <70.

## 2023-02-13 ENCOUNTER — Encounter: Payer: Self-pay | Admitting: Internal Medicine

## 2023-02-14 ENCOUNTER — Other Ambulatory Visit: Payer: Self-pay

## 2023-02-14 MED ORDER — DEXCOM G7 SENSOR MISC: 6 refills | Status: AC

## 2023-02-14 MED ORDER — DEXCOM G7 RECEIVER DEVI: 0 refills | Status: AC

## 2023-02-19 ENCOUNTER — Telehealth: Payer: Self-pay

## 2023-02-19 ENCOUNTER — Other Ambulatory Visit (HOSPITAL_COMMUNITY): Payer: Self-pay

## 2023-02-19 NOTE — Telephone Encounter (Signed)
Pharmacy Patient Advocate Encounter  Received notification from [facility] that Prior Authorization for Dexcom G7 Sensor  has been APPROVED from 02/19/23 to 02/19/24. Ran test claim, Copay is $30. This test claim was processed through [facility]- copay amounts may vary at other pharmacies due to pharmacy/plan contracts, or as the patient moves through the different stages of their insurance plan.   PA #/Case ID/Reference #: [id number]

## 2023-02-19 NOTE — Telephone Encounter (Signed)
Pharmacy Patient Advocate Encounter   Received notification from Patient Advice Request messages that prior authorization for Dexcom G7 Sensor is required/requested.   Insurance verification completed.   The patient is insured through [facility] .   Per test claim: PA required; PA submitted to above mentioned insurance via CoverMyMeds Key/confirmation #/EOC [id number] Status is pending

## 2023-03-13 ENCOUNTER — Encounter: Payer: Self-pay | Admitting: Internal Medicine

## 2023-03-13 ENCOUNTER — Telehealth: Payer: 59 | Admitting: Internal Medicine

## 2023-03-13 DIAGNOSIS — Z794 Long term (current) use of insulin: Secondary | ICD-10-CM

## 2023-03-13 DIAGNOSIS — J4531 Mild persistent asthma with (acute) exacerbation: Secondary | ICD-10-CM | POA: Diagnosis not present

## 2023-03-13 DIAGNOSIS — E118 Type 2 diabetes mellitus with unspecified complications: Secondary | ICD-10-CM

## 2023-03-13 MED ORDER — LANTUS SOLOSTAR 100 UNIT/ML ~~LOC~~ SOPN: 40.0000 [IU] | PEN_INJECTOR | Freq: Every day | SUBCUTANEOUS | 10 refills | Status: DC

## 2023-03-13 MED ORDER — FLUTICASONE-SALMETEROL 250-50 MCG/ACT IN AEPB: 1.0000 | INHALATION_SPRAY | Freq: Two times a day (BID) | RESPIRATORY_TRACT | 11 refills | Status: DC

## 2023-03-13 MED ORDER — ALBUTEROL SULFATE HFA 108 (90 BASE) MCG/ACT IN AERS: 1.0000 | INHALATION_SPRAY | Freq: Four times a day (QID) | RESPIRATORY_TRACT | 1 refills | Status: DC | PRN

## 2023-03-13 MED ORDER — IPRATROPIUM-ALBUTEROL 0.5-2.5 (3) MG/3ML IN SOLN: 3.0000 mL | Freq: Four times a day (QID) | RESPIRATORY_TRACT | 2 refills | Status: DC | PRN

## 2023-03-13 MED ORDER — PREDNISONE 20 MG PO TABS: 40.0000 mg | ORAL_TABLET | Freq: Every day | ORAL | 0 refills | Status: AC

## 2023-03-13 NOTE — Progress Notes (Signed)
Virtual Visit via Video Note  I connected with Destiny Trujillo on 03/13/23 at 10:20 AM EST by a video enabled telemedicine application and verified that I am speaking with the correct person using two identifiers.  The patient and the provider were at separate locations throughout the entire encounter. Patient location: home, Provider location: work   I discussed the limitations of evaluation and management by telemedicine and the availability of in person appointments. The patient expressed understanding and agreed to proceed. The patient and the provider were the only parties present for the visit unless noted in HPI below.  History of Present Illness: The patient is a 58 y.o. female with visit for asthma flare up due to cat dander in her air vents. Started about 1 month ago and using more nebulizer treatments and advair. She is not improving but not worsening. No cough or fevers or chills. No URI symptoms.  Observations/Objective: Appearance: normal, breathing appears normal speaking in full sentences no coughing during visit, casual grooming, abdomen does not appear distended, mental status is A and O times 3  Assessment and Plan: See problem oriented charting  Follow Up Instructions: rx prednisone and refill inhaler advair and albuterol and duoneb solution.   I discussed the assessment and treatment plan with the patient. The patient was provided an opportunity to ask questions and all were answered. The patient agreed with the plan and demonstrated an understanding of the instructions.   The patient was advised to call back or seek an in-person evaluation if the symptoms worsen or if the condition fails to improve as anticipated.  Myrlene Broker, MD

## 2023-03-13 NOTE — Assessment & Plan Note (Signed)
Needs refill on lantus which is done and counseled about the risk of rising blood sugars with prednisone treatment.

## 2023-03-13 NOTE — Assessment & Plan Note (Signed)
With acute exacerbation due to cat dander likely. Rx prednisone and refilled advair and albuterol inhaler and duoneb solution. She will let us know if no improvement.

## 2023-03-14 ENCOUNTER — Other Ambulatory Visit: Payer: Self-pay

## 2023-03-14 NOTE — Telephone Encounter (Signed)
Please start PA for inhaler

## 2023-03-14 NOTE — Telephone Encounter (Signed)
Does this need a prior authorization.

## 2023-03-19 ENCOUNTER — Telehealth: Payer: Self-pay

## 2023-03-19 ENCOUNTER — Other Ambulatory Visit (HOSPITAL_COMMUNITY): Payer: Self-pay

## 2023-03-19 NOTE — Telephone Encounter (Signed)
 Pharmacy Patient Advocate Encounter   Received notification from Patient Advice Request messages that prior authorization for Ventolin HFA is required/requested.   Insurance verification completed.   The patient is insured through [facility] .   Per test claim: PA required; PA started via CoverMyMeds. KEY [id number] . Please see clinical question(s) below that I am not finding the answer to in her chart and advise.

## 2023-03-19 NOTE — Telephone Encounter (Signed)
**Note De-identified  [place] Obfuscation** Please advise 

## 2023-03-19 NOTE — Telephone Encounter (Signed)
 Pharmacy Patient Advocate Encounter   Received notification from Patient Advice Request messages that prior authorization for Advair Diskus 250/50 is required/requested.   Insurance verification completed.   The patient is insured through [facility] .   Per test claim: PA required; PA started via CoverMyMeds. KEY [id number] . Waiting for clinical questions to populate.

## 2023-03-21 NOTE — Telephone Encounter (Signed)
 Is PA denied? If not do please

## 2023-03-24 ENCOUNTER — Other Ambulatory Visit (HOSPITAL_COMMUNITY): Payer: Self-pay

## 2023-03-27 ENCOUNTER — Other Ambulatory Visit (HOSPITAL_COMMUNITY): Payer: Self-pay

## 2023-03-27 NOTE — Telephone Encounter (Signed)
 Placed a call to [phone] to check the status of the prior auth.   Submitted a new case over the phone with the rep.   Case ID: [id number]

## 2023-03-27 NOTE — Telephone Encounter (Signed)
 Placed a call to [phone] to check the status of the prior auth.   Submitted a new case over the phone with the rep.   Case ID: [id number]

## 2023-03-28 NOTE — Telephone Encounter (Signed)
 Pharmacy Patient Advocate Encounter  Received notification from [facility] that Prior Authorization for Ventolin HFA has been DENIED.  Full denial letter will be uploaded to the media tab. See denial reason below.   PA #/Case ID/Reference #: [id number]

## 2023-03-28 NOTE — Telephone Encounter (Signed)
 Pharmacy Patient Advocate Encounter  Received notification from [facility] that Prior Authorization for Advair Diskus 250-50 has been DENIED.  Full denial letter will be uploaded to the media tab. See denial reason below.   PA #/Case ID/Reference #: [id number]

## 2023-04-06 ENCOUNTER — Other Ambulatory Visit: Payer: Self-pay | Admitting: Internal Medicine

## 2023-04-22 ENCOUNTER — Ambulatory Visit: Payer: Self-pay

## 2023-04-22 ENCOUNTER — Encounter: Payer: Self-pay | Admitting: Emergency Medicine

## 2023-04-22 ENCOUNTER — Ambulatory Visit (INDEPENDENT_AMBULATORY_CARE_PROVIDER_SITE_OTHER): Admitting: Emergency Medicine

## 2023-04-22 VITALS — BP 110/74 | HR 78 | Temp 98.7°F | Ht 59.0 in | Wt 175.0 lb

## 2023-04-22 DIAGNOSIS — I10 Essential (primary) hypertension: Secondary | ICD-10-CM

## 2023-04-22 DIAGNOSIS — J4531 Mild persistent asthma with (acute) exacerbation: Secondary | ICD-10-CM | POA: Diagnosis not present

## 2023-04-22 MED ORDER — PREDNISONE 20 MG PO TABS: 40.0000 mg | ORAL_TABLET | Freq: Every day | ORAL | 0 refills | Status: AC

## 2023-04-22 NOTE — Assessment & Plan Note (Signed)
 BP Readings from Last 3 Encounters:  04/22/23 110/74  11/28/22 120/76  04/30/22 118/80  Well-controlled hypertension Continue Zestoretic 20-12.5 mg daily

## 2023-04-22 NOTE — Assessment & Plan Note (Signed)
 Chronic condition with acute exacerbation Clinically stable.  No red flag signs or symptoms. Continue using DuoNeb nebulizers as needed Recommend to change maintenance daily treatment to either Trelegy or Breztri as per insurance's formulary.  Sample given today. Advised to rest and stay well-hydrated. No signs of clinical infection. ED precautions given Advised to contact the office if no better or worse during the next several days or weeks.

## 2023-04-22 NOTE — Telephone Encounter (Signed)
 Copied from CRM [id number]. Topic: Clinical - Red Word Triage >> Apr 22, 2023 10:30 AM Desma Mcgregor wrote: Red Word that prompted transfer to Nurse Triage: Patient has asthma and had attacks. Still having problems breathing for the past week. Has a breathing machine and ran out of Prednisone, but says its still not helping much. Feels she is using the machine more than she should. Looking to come in to see any available doctor.   Chief Complaint: Pt. Reports asthma "has flared up again. I just finished some Prednisone but I'm still coughing, wheezing, SOB." Symptoms: Above Frequency: 2 weeks Pertinent Negatives: Patient denies  Disposition: [] ED /[] Urgent Care (no appt availability in office) / [x] Appointment(In office/virtual)/ []  [facility]/ [] Home Care/ [] Refused Recommended Disposition /[] [facility]/ []  Follow-up with PCP Additional Notes: Agrees with appointment.  Reason for Disposition  [1] MILD asthma attack (e.g., no SOB at rest, mild SOB with walking, speaks normally in sentences, mild wheezing) AND [2] lasting > 24 hours on prescribed treatment  Answer Assessment - Initial Assessment Questions 1. RESPIRATORY STATUS: "Describe your breathing?" (e.g., wheezing, shortness of breath, unable to speak, severe coughing)      SOB 2. ONSET: "When did this asthma attack begin?"      2 weeks 3. TRIGGER: "What do you think triggered this attack?" (e.g., URI, exposure to pollen or other allergen, tobacco smoke)      Unsure 4. PEAK EXPIRATORY FLOW RATE (PEFR): "Do you use a peak flow meter?" If Yes, ask: "What's the current peak flow? What's your personal best peak flow?"      No 5. SEVERITY: "How bad is this attack?"    - MILD: No SOB at rest, mild SOB with walking, speaks normally in sentences, can lie down, no retractions, pulse < 100. (GREEN Zone: PEFR 80-100%)   - MODERATE: SOB at rest, SOB with minimal exertion and prefers to sit, cannot lie down flat, speaks in phrases,  mild retractions, audible wheezing, pulse 100-120. (YELLOW Zone: PEFR 50-79%)    - SEVERE: Struggling for each breath, speaks in single words, struggling to breathe, sitting hunched forward, retractions, usually loud wheezing, sometimes minimal wheezing because of decreased air movement, pulse > 120. (RED Zone: PEFR < 50%).      no 6. ASTHMA MEDICINES:  "What treatments have you tried?"    - INHALED QUICK RELIEF (RESCUE): "What is your inhaled quick-relief medicine?" (e.g., albuterol, salbutamol) "Do you use an inhaler or a nebulizer?" "How frequently have you been using this medicine?"   - CONTROLLER (LONG-TERM-CONTROL): "Do you take an inhaled steroid? (e.g., Asmanex, Flovent, Pulmicort, Qvar)     Yes 7. INHALED QUICK-RELIEF TREATMENTS FOR THIS ATTACK: "What treatments have you given yourself so far?" and "How many and how often?" If using an inhaler, ask, "How many puffs?" Note: Routine treatments are 2 puffs every 4 hours as needed. Rescue treatments are 4 puffs repeated every 20 minutes, up to three times as needed.      Albuterol 8. OTHER SYMPTOMS: "Do you have any other symptoms? (e.g., chest pain, coughing up yellow sputum, fever, runny nose)     Wheezing, cough 9. O2 SATURATION MONITOR:  "Do you use an oxygen saturation monitor (pulse oximeter) at home?" If Yes, "What is your reading (oxygen level) today?" "What is your usual oxygen saturation reading?" (e.g., 95%)     No 10. PREGNANCY: "Is there any chance you are pregnant?" "When was your last menstrual period?"  No  Protocols used: Asthma Attack-A-AH

## 2023-04-22 NOTE — Patient Instructions (Signed)
 Asthma, Adult  Asthma is a condition that causes swelling and narrowing of the airways. These are the passages that lead from the nose and mouth down into the lungs. When asthma symptoms get worse it is called an asthma attack or flare. This can make it hard to breathe. Asthma flares can range from minor to life-threatening. There is no cure for asthma, but medicines and lifestyle changes can help to control it. What are the causes? It is not known exactly what causes asthma, but certain things can cause asthma symptoms to get worse (triggers). What can trigger an asthma attack? Cigarette smoke. Mold. Dust. Your pet's skin flakes (dander). Cockroaches. Pollen. Air pollution (like household cleaners, wood smoke, smog, or Therapist, occupational). What are the signs or symptoms? Trouble breathing (shortness of breath). Coughing. Making high-pitched whistling sounds when you breathe, most often when you breathe out (wheezing). Chest tightness. Tiredness with little activity. Poor exercise tolerance. How is this treated? Controller medicines that help prevent asthma symptoms. Fast-acting reliever or rescue medicines. These give short-term relief of asthma symptoms. Allergy medicines if your attacks are brought on by allergens. Medicines to help control the body's defense (immune) system. Staying away from the things that cause asthma attacks. Follow these instructions at home: Avoiding triggers in your home Do not allow anyone to smoke in your home. Limit use of fireplaces and wood stoves. Get rid of pests (such as roaches and mice) and their droppings. Keep your home clean. Clean your floors. Dust regularly. Use cleaning products that do not smell. Wash bed sheets and blankets every week in hot water. Dry them in a dryer. Have someone vacuum when you are not home. Change your heating and air conditioning filters often. Use blankets that are made of polyester or cotton. General  instructions Take over-the-counter and prescription medicines only as told by your doctor. Do not smoke or use any products that contain nicotine or tobacco. If you need help quitting, ask your doctor. Stay away from secondhand smoke. Avoid doing things outdoors when allergen counts are high and when air quality is low. Warm up before you exercise. Take time to cool down after exercise. Use a peak flow meter as told by your doctor. A peak flow meter is a tool that measures how well your lungs are working. Keep track of the peak flow meter's readings. Write them down. Follow your asthma action plan. This is a written plan for taking care of your asthma and treating your attacks. Make sure you get all the shots (vaccines) that your doctor recommends. Ask your doctor about a flu shot and a pneumonia shot. Keep all follow-up visits. Contact a doctor if: You have wheezing, shortness of breath, or a cough even while taking medicine to prevent attacks. The mucus you cough up (sputum) is thicker than usual. The mucus you cough up changes from clear or white to yellow, green, gray, or is bloody. You have problems from the medicine you are taking, such as: A rash. Itching. Swelling. Trouble breathing. You need reliever medicines more than 2-3 times a week. Your peak flow reading is still at 50-79% of your personal best after following the action plan for 1 hour. You have a fever. Get help right away if: You seem to be worse and are not responding to medicine during an asthma attack. You are short of breath even at rest. You get short of breath when doing very little activity. You have trouble eating, drinking, or talking. You have chest  pain or tightness. You have a fast heartbeat. Your lips or fingernails start to turn blue. You are light-headed or dizzy, or you faint. Your peak flow is less than 50% of your personal best. You feel too tired to breathe normally. These symptoms may be an  emergency. Get help right away. Call 911. Do not wait to see if the symptoms will go away. Do not drive yourself to the hospital. Summary Asthma is a long-term (chronic) condition in which the airways get tight and narrow. An asthma attack can make it hard to breathe. Asthma cannot be cured, but medicines and lifestyle changes can help control it. Make sure you understand how to avoid triggers and how and when to use your medicines. Avoid things that can cause allergy symptoms (allergens). These include animal skin flakes (dander) and pollen from trees or grass. Avoid things that pollute the air. These may include household cleaners, wood smoke, smog, or chemical odors. This information is not intended to replace advice given to you by your health care provider. Make sure you discuss any questions you have with your health care provider. Document Revised: 10/16/2020 Document Reviewed: 10/16/2020 Elsevier Patient Education  2024 [employer].

## 2023-04-22 NOTE — Progress Notes (Signed)
 Destiny Trujillo 58 y.o.   Chief Complaint  Patient presents with   Asthma    Patient states this has been going on for 3-4 weeks. She was given prednisone that helped for short period but comes back. She does have 2 inhalers but they to only help for a short period. Still having SOB and wheezing     HISTORY OF PRESENT ILLNESS: Acute problem visit today.  Patient of Dr. Hillard Danker. This is a 58 y.o. female with history of asthma complaining of persistent intermittent episodes of wheezing and difficulty breathing for the last 3 to 4 weeks Took prednisone 20 mg for 7 days which helped.  Finished 2 weeks ago Dry cough.  Non-smoker.  Asthma She complains of cough, shortness of breath and wheezing. There is no hemoptysis or sputum production. Pertinent negatives include no chest pain, fever, headaches or sore throat. Her past medical history is significant for asthma.     Prior to Admission medications   Medication Sig Start Date End Date Taking? Authorizing Provider  albuterol (VENTOLIN HFA) 108 (90 Base) MCG/ACT inhaler Inhale 1 puff into the lungs every 6 (six) hours as needed as needed for wheezing or shortness of breath.. 03/13/23  Yes Myrlene Broker, MD  aspirin 81 MG tablet Take 81 mg by mouth daily.   Yes [provider]  BD PEN NEEDLE NANO U/F 32G X 4 MM MISC Use 1 each to inject insulin into skin daily. 04/07/23  Yes Myrlene Broker, MD  Continuous Glucose Receiver (DEXCOM G7 RECEIVER) DEVI Use to measure sugars 02/14/23  Yes Myrlene Broker, MD  Continuous Glucose Sensor (DEXCOM G7 SENSOR) MISC Change every 10 days to monitor sugars. 02/14/23  Yes Myrlene Broker, MD  Continuous Glucose Transmitter (DEXCOM G6 TRANSMITTER) MISC Apply 1 each topically every 90 days. 07/31/22  Yes Myrlene Broker, MD  cyclobenzaprine (FLEXERIL) 5 MG tablet Take 1 tablet (5 mg total) by mouth 3 (three) times daily as needed for muscle spasms. 03/02/21  Yes  Myrlene Broker, MD  fluticasone Aleda Grana) 50 MCG/ACT nasal spray Place 2 sprays into both nostrils daily. 02/21/21  Yes Myrlene Broker, MD  fluticasone-salmeterol (ADVAIR) 250-50 MCG/ACT AEPB Inhale 1 puff into the lungs in the morning and at bedtime. 03/13/23  Yes Myrlene Broker, MD  insulin glargine (LANTUS SOLOSTAR) 100 UNIT/ML Solostar Pen Inject 40 Units into the skin daily. 03/13/23  Yes Myrlene Broker, MD  ipratropium-albuterol (DUONEB) 0.5-2.5 (3) MG/3ML SOLN Take 3 mLs by nebulization every 6 (six) hours as needed. 03/13/23  Yes Myrlene Broker, MD  lisinopril-hydrochlorothiazide (ZESTORETIC) 20-12.5 MG tablet Take 1 tablet by mouth daily. 11/25/22  Yes Myrlene Broker, MD  loratadine (CLARITIN) 10 MG tablet Take 1 tablet (10 mg total) by mouth daily. 02/21/21  Yes Myrlene Broker, MD  metFORMIN (GLUCOPHAGE) 1000 MG tablet TAKE 1 TABLET (1,000 MG TOTAL) BY MOUTH 2 (TWO) TIMES A DAY WITH MEALS. 09/25/22  Yes Myrlene Broker, MD  montelukast (SINGULAIR) 10 MG tablet Take 1 tablet (10 mg total) by mouth at bedtime. 04/07/23  Yes Myrlene Broker, MD  Multiple Vitamin (MULTIVITAMIN) capsule Take 1 capsule by mouth daily.   Yes [provider]  ondansetron (ZOFRAN) 4 MG tablet Take 1 tablet (4 mg total) by mouth every 8 (eight) hours as needed for nausea or vomiting. 01/01/22  Yes Myrlene Broker, MD  pantoprazole (PROTONIX) 40 MG tablet Take 1 tablet (40 mg total) by  mouth daily. 01/01/22  Yes Myrlene Broker, MD  simvastatin (ZOCOR) 40 MG tablet Take 1 tablet (40 mg total) by mouth daily. 04/07/23  Yes Myrlene Broker, MD  traZODone (DESYREL) 50 MG tablet Take 1 tablet (50 mg total) by mouth at bedtime. 02/21/21  Yes Myrlene Broker, MD    Allergies  Allergen Reactions   Penicillins    Penicillins Nausea And Vomiting and Rash    Has patient had a PCN reaction causing immediate rash, facial/tongue/throat swelling,  SOB or lightheadedness with hypotension: No Has patient had a PCN reaction causing severe rash involving mucus membranes or skin necrosis: Yes Has patient had a PCN reaction that required hospitalization No Has patient had a PCN reaction occurring within the last 10 years: Yes If all of the above answers are "NO", then may proceed with Cephalosporin use.    Patient Active Problem List   Diagnosis Date Noted   Routine general medical examination at a health care facility 12/10/2018   Insomnia 05/11/2018   Type 2 diabetes with complication (HCC) 03/18/2015   Essential hypertension 03/18/2015   Hyperlipidemia associated with type 2 diabetes mellitus (HCC) 03/18/2015   Asthma 03/18/2015    Past Medical History:  Diagnosis Date   Allergy    seasonal   Asthma    Depression    Diabetes mellitus without complication (HCC)    Hyperlipidemia    Hypertension     No past surgical history on file.  Social History   Socioeconomic History   Marital status: Single    Spouse name: Not on file   Number of children: Not on file   Years of education: Not on file   Highest education level: Not on file  Occupational History   Not on file  Tobacco Use   Smoking status: Never   Smokeless tobacco: Never  Substance and Sexual Activity   Alcohol use: No    Alcohol/week: 0.0 standard drinks of alcohol   Drug use: No   Sexual activity: Not Currently  Other Topics Concern   Not on file  Social History Narrative   ** Merged History Encounter **       Social Drivers of Corporate investment banker Strain: Not on file  Food Insecurity: Not on file  Transportation Needs: Not on file  Physical Activity: Not on file  Stress: Not on file  Social Connections: Not on file  Intimate Partner Violence: Not on file    Family History  Problem Relation Age of Onset   Hypertension Mother    Diabetes Mother    Dementia Mother    Hypertension Father    Diabetes Father    Diabetes Sister     Angina Sister    Cancer Brother    Cancer Maternal Aunt      Review of Systems  Constitutional: Negative.  Negative for chills and fever.  HENT: Negative.  Negative for congestion and sore throat.   Respiratory:  Positive for cough, shortness of breath and wheezing. Negative for hemoptysis and sputum production.   Cardiovascular: Negative.  Negative for chest pain and palpitations.  Gastrointestinal:  Negative for abdominal pain, nausea and vomiting.  Neurological: Negative.  Negative for dizziness and headaches.  All other systems reviewed and are negative.   Today's Vitals   04/22/23 1343  BP: 110/74  Pulse: 78  Temp: 98.7 F (37.1 C)  TempSrc: Oral  SpO2: 98%  Weight: 175 lb (79.4 kg)  Height: 4\' 11"  (1.499 m)  Body mass index is 35.35 kg/m.   Physical Exam Vitals reviewed.  Constitutional:      Appearance: Normal appearance.  HENT:     Head: Normocephalic.     Mouth/Throat:     Mouth: Mucous membranes are moist.     Pharynx: Oropharynx is clear.  Eyes:     Extraocular Movements: Extraocular movements intact.     Conjunctiva/sclera: Conjunctivae normal.     Pupils: Pupils are equal, round, and reactive to light.  Cardiovascular:     Rate and Rhythm: Normal rate and regular rhythm.     Pulses: Normal pulses.     Heart sounds: Normal heart sounds.  Pulmonary:     Effort: Pulmonary effort is normal.     Breath sounds: Normal breath sounds.  Musculoskeletal:     Cervical back: No tenderness.  Lymphadenopathy:     Cervical: No cervical adenopathy.  Skin:    General: Skin is warm and dry.  Neurological:     General: No focal deficit present.     Mental Status: She is alert and oriented to person, place, and time.  Psychiatric:        Mood and Affect: Mood normal.        Behavior: Behavior normal.      ASSESSMENT & PLAN: A total of 43 minutes was spent with the patient and counseling/coordination of care regarding preparing for this visit, review of  most recent office visit notes, review of multiple chronic medical conditions and their management, review of all medications and changes made, review of most recent bloodwork results, review of health maintenance items, education on nutrition, prognosis, documentation, and need for follow up.   Problem List Items Addressed This Visit       Cardiovascular and Mediastinum   Essential hypertension   BP Readings from Last 3 Encounters:  04/22/23 110/74  11/28/22 120/76  04/30/22 118/80  Well-controlled hypertension Continue Zestoretic 20-12.5 mg daily         Respiratory   Asthma - Primary   Chronic condition with acute exacerbation Clinically stable.  No red flag signs or symptoms. Continue using DuoNeb nebulizers as needed Recommend to change maintenance daily treatment to either Trelegy or Breztri as per insurance's formulary.  Sample given today. Advised to rest and stay well-hydrated. No signs of clinical infection. ED precautions given Advised to contact the office if no better or worse during the next several days or weeks.      Relevant Medications   predniSONE (DELTASONE) 20 MG tablet   Patient Instructions  Asthma, Adult  Asthma is a condition that causes swelling and narrowing of the airways. These are the passages that lead from the nose and mouth down into the lungs. When asthma symptoms get worse it is called an asthma attack or flare. This can make it hard to breathe. Asthma flares can range from minor to life-threatening. There is no cure for asthma, but medicines and lifestyle changes can help to control it. What are the causes? It is not known exactly what causes asthma, but certain things can cause asthma symptoms to get worse (triggers). What can trigger an asthma attack? Cigarette smoke. Mold. Dust. Your pet's skin flakes (dander). Cockroaches. Pollen. Air pollution (like household cleaners, wood smoke, smog, or Therapist, occupational). What are the signs or  symptoms? Trouble breathing (shortness of breath). Coughing. Making high-pitched whistling sounds when you breathe, most often when you breathe out (wheezing). Chest tightness. Tiredness with little activity. Poor exercise tolerance. How  is this treated? Controller medicines that help prevent asthma symptoms. Fast-acting reliever or rescue medicines. These give short-term relief of asthma symptoms. Allergy medicines if your attacks are brought on by allergens. Medicines to help control the body's defense (immune) system. Staying away from the things that cause asthma attacks. Follow these instructions at home: Avoiding triggers in your home Do not allow anyone to smoke in your home. Limit use of fireplaces and wood stoves. Get rid of pests (such as roaches and mice) and their droppings. Keep your home clean. Clean your floors. Dust regularly. Use cleaning products that do not smell. Wash bed sheets and blankets every week in hot water. Dry them in a dryer. Have someone vacuum when you are not home. Change your heating and air conditioning filters often. Use blankets that are made of polyester or cotton. General instructions Take over-the-counter and prescription medicines only as told by your doctor. Do not smoke or use any products that contain nicotine or tobacco. If you need help quitting, ask your doctor. Stay away from secondhand smoke. Avoid doing things outdoors when allergen counts are high and when air quality is low. Warm up before you exercise. Take time to cool down after exercise. Use a peak flow meter as told by your doctor. A peak flow meter is a tool that measures how well your lungs are working. Keep track of the peak flow meter's readings. Write them down. Follow your asthma action plan. This is a written plan for taking care of your asthma and treating your attacks. Make sure you get all the shots (vaccines) that your doctor recommends. Ask your doctor about a flu  shot and a pneumonia shot. Keep all follow-up visits. Contact a doctor if: You have wheezing, shortness of breath, or a cough even while taking medicine to prevent attacks. The mucus you cough up (sputum) is thicker than usual. The mucus you cough up changes from clear or white to yellow, green, gray, or is bloody. You have problems from the medicine you are taking, such as: A rash. Itching. Swelling. Trouble breathing. You need reliever medicines more than 2-3 times a week. Your peak flow reading is still at 50-79% of your personal best after following the action plan for 1 hour. You have a fever. Get help right away if: You seem to be worse and are not responding to medicine during an asthma attack. You are short of breath even at rest. You get short of breath when doing very little activity. You have trouble eating, drinking, or talking. You have chest pain or tightness. You have a fast heartbeat. Your lips or fingernails start to turn blue. You are light-headed or dizzy, or you faint. Your peak flow is less than 50% of your personal best. You feel too tired to breathe normally. These symptoms may be an emergency. Get help right away. Call 911. Do not wait to see if the symptoms will go away. Do not drive yourself to the hospital. Summary Asthma is a long-term (chronic) condition in which the airways get tight and narrow. An asthma attack can make it hard to breathe. Asthma cannot be cured, but medicines and lifestyle changes can help control it. Make sure you understand how to avoid triggers and how and when to use your medicines. Avoid things that can cause allergy symptoms (allergens). These include animal skin flakes (dander) and pollen from trees or grass. Avoid things that pollute the air. These may include household cleaners, wood smoke, smog, or chemical  odors. This information is not intended to replace advice given to you by your health care provider. Make sure you  discuss any questions you have with your health care provider. Document Revised: 10/16/2020 Document Reviewed: 10/16/2020 Elsevier Patient Education  2024 Elsevier Inc.    Edwina Barth, MD [facility]

## 2023-05-26 ENCOUNTER — Ambulatory Visit: Admitting: Internal Medicine

## 2023-05-28 ENCOUNTER — Ambulatory Visit: Payer: 59 | Admitting: Internal Medicine

## 2023-06-10 ENCOUNTER — Ambulatory Visit: Admitting: Internal Medicine

## 2023-06-10 ENCOUNTER — Other Ambulatory Visit: Payer: Self-pay | Admitting: Internal Medicine

## 2023-06-23 ENCOUNTER — Other Ambulatory Visit: Payer: Self-pay | Admitting: Internal Medicine

## 2023-07-30 ENCOUNTER — Ambulatory Visit (INDEPENDENT_AMBULATORY_CARE_PROVIDER_SITE_OTHER): Payer: PRIVATE HEALTH INSURANCE | Admitting: Internal Medicine

## 2023-07-30 ENCOUNTER — Encounter: Payer: Self-pay | Admitting: Internal Medicine

## 2023-07-30 VITALS — BP 114/80 | HR 79 | Temp 98.1°F | Ht 59.0 in | Wt 178.0 lb

## 2023-07-30 DIAGNOSIS — I10 Essential (primary) hypertension: Secondary | ICD-10-CM | POA: Diagnosis not present

## 2023-07-30 DIAGNOSIS — E118 Type 2 diabetes mellitus with unspecified complications: Secondary | ICD-10-CM | POA: Diagnosis not present

## 2023-07-30 DIAGNOSIS — Z7984 Long term (current) use of oral hypoglycemic drugs: Secondary | ICD-10-CM | POA: Diagnosis not present

## 2023-07-30 DIAGNOSIS — R1013 Epigastric pain: Secondary | ICD-10-CM | POA: Diagnosis not present

## 2023-07-30 DIAGNOSIS — Z1211 Encounter for screening for malignant neoplasm of colon: Secondary | ICD-10-CM

## 2023-07-30 LAB — URINALYSIS, ROUTINE W REFLEX MICROSCOPIC
Bilirubin Urine: NEGATIVE
Hgb urine dipstick: NEGATIVE
Ketones, ur: NEGATIVE
Nitrite: NEGATIVE
RBC / HPF: NONE SEEN (ref 0–?)
Specific Gravity, Urine: 1.01 (ref 1.000–1.030)
Total Protein, Urine: NEGATIVE
Urine Glucose: NEGATIVE
Urobilinogen, UA: 0.2 (ref 0.0–1.0)
pH: 7.5 (ref 5.0–8.0)

## 2023-07-30 LAB — COMPREHENSIVE METABOLIC PANEL WITH GFR
ALT: 14 U/L (ref 0–35)
AST: 16 U/L (ref 0–37)
Albumin: 4.7 g/dL (ref 3.5–5.2)
Alkaline Phosphatase: 74 U/L (ref 39–117)
BUN: 13 mg/dL (ref 6–23)
CO2: 33 meq/L — ABNORMAL HIGH (ref 19–32)
Calcium: 10.1 mg/dL (ref 8.4–10.5)
Chloride: 98 meq/L (ref 96–112)
Creatinine, Ser: 0.89 mg/dL (ref 0.40–1.20)
GFR: 71.2 mL/min (ref >60.00)
Glucose, Bld: 190 mg/dL — ABNORMAL HIGH (ref 70–99)
Potassium: 4.5 meq/L (ref 3.5–5.1)
Sodium: 137 meq/L (ref 135–145)
Total Bilirubin: 0.4 mg/dL (ref 0.2–1.2)
Total Protein: 8 g/dL (ref 6.0–8.3)

## 2023-07-30 LAB — MICROALBUMIN / CREATININE URINE RATIO
Creatinine,U: 86.2 mg/dL
Microalb Creat Ratio: UNDETERMINED mg/g (ref 0.0–30.0)
Microalb, Ur: <0.7 mg/dL

## 2023-07-30 LAB — CBC
HCT: 41 % (ref 36.0–46.0)
Hemoglobin: 13.5 g/dL (ref 12.0–15.0)
MCHC: 32.9 g/dL (ref 30.0–36.0)
MCV: 79 fl (ref 78.0–100.0)
Platelets: 301 K/uL (ref 150.0–400.0)
RBC: 5.18 Mil/uL — ABNORMAL HIGH (ref 3.87–5.11)
RDW: 14.4 % (ref 11.5–15.5)
WBC: 6.6 K/uL (ref 4.0–10.5)

## 2023-07-30 LAB — HEMOGLOBIN A1C: Hgb A1c MFr Bld: 7.2 % — ABNORMAL HIGH (ref 4.6–6.5)

## 2023-07-30 NOTE — Telephone Encounter (Signed)
 I see that in your notes Protonix  was sent in but It still shows up not taking on her med list

## 2023-07-30 NOTE — Assessment & Plan Note (Signed)
 BP at goal on lisinopril /hydrochlorothiazide . Checking CMP and adjust as needed.

## 2023-07-30 NOTE — Patient Instructions (Signed)
 We have sent in the protonix  to take 1 pill daily for 2-3 weeks. You should get relief within 2-3 days let us  know if you are not.  We will also have you do the prune juice or fiber daily for the next 2 weeks to help empty out.

## 2023-07-30 NOTE — Assessment & Plan Note (Signed)
 Needs follow up HgA1c and UACR. Adjust as needed lantus  and metformin . On statin and ACE-I.

## 2023-07-30 NOTE — Assessment & Plan Note (Signed)
 Rx protonix  40 mg daily for 2-3 weeks. Can use tums as well as needed. If no improvement further workup needed.

## 2023-07-30 NOTE — Progress Notes (Signed)
   Subjective:   Patient ID: Destiny Trujillo, female    DOB: [birth year], 58 y.o.   MRN: [id number]  HPI The patient is a 58 YO female coming in for stomach pain more toward the top. She is a little constipated but going just not regularly. She is not taking ppi any longer.   Review of Systems  Constitutional: Negative.   HENT: Negative.    Eyes: Negative.   Respiratory:  Negative for cough, chest tightness and shortness of breath.   Cardiovascular:  Negative for chest pain, palpitations and leg swelling.  Gastrointestinal:  Positive for abdominal pain. Negative for abdominal distention, constipation, diarrhea, nausea and vomiting.  Musculoskeletal: Negative.   Skin: Negative.   Neurological: Negative.   Psychiatric/Behavioral: Negative.      Objective:  Physical Exam Constitutional:      Appearance: She is well-developed.  HENT:     Head: Normocephalic and atraumatic.  Cardiovascular:     Rate and Rhythm: Normal rate and regular rhythm.  Pulmonary:     Effort: Pulmonary effort is normal. No respiratory distress.     Breath sounds: Normal breath sounds. No wheezing or rales.  Abdominal:     General: Bowel sounds are normal. There is no distension.     Palpations: Abdomen is soft.     Tenderness: There is abdominal tenderness in the epigastric area. There is no rebound.  Musculoskeletal:     Cervical back: Normal range of motion.  Skin:    General: Skin is warm and dry.  Neurological:     Mental Status: She is alert and oriented to person, place, and time.     Coordination: Coordination normal.     Vitals:   07/30/23 0936  BP: 114/80  Pulse: 79  Temp: 98.1 F (36.7 C)  TempSrc: Oral  SpO2: 97%  Weight: 178 lb (80.7 kg)  Height: 4' 11 (1.499 m)    Assessment & Plan:

## 2023-07-31 ENCOUNTER — Other Ambulatory Visit: Payer: Self-pay

## 2023-07-31 DIAGNOSIS — I10 Essential (primary) hypertension: Secondary | ICD-10-CM

## 2023-07-31 MED ORDER — LISINOPRIL-HYDROCHLOROTHIAZIDE 20-12.5 MG PO TABS: 1.0000 | ORAL_TABLET | Freq: Every day | ORAL | 6 refills | Status: DC

## 2023-07-31 MED ORDER — PANTOPRAZOLE SODIUM 40 MG PO TBEC: 40.0000 mg | DELAYED_RELEASE_TABLET | Freq: Every day | ORAL | 3 refills | Status: DC

## 2023-07-31 MED ORDER — LORATADINE 10 MG PO TABS: 10.0000 mg | ORAL_TABLET | Freq: Every day | ORAL | 3 refills | Status: DC

## 2023-07-31 MED ORDER — MONTELUKAST SODIUM 10 MG PO TABS: 10.0000 mg | ORAL_TABLET | Freq: Every day | ORAL | 0 refills | Status: DC

## 2023-07-31 MED ORDER — LANTUS SOLOSTAR 100 UNIT/ML ~~LOC~~ SOPN
40.0000 [IU] | PEN_INJECTOR | Freq: Every day | SUBCUTANEOUS | 10 refills | Status: DC
Start: 2023-07-31 — End: 2023-09-17

## 2023-07-31 MED ORDER — METFORMIN HCL 1000 MG PO TABS: ORAL_TABLET | ORAL | 3 refills | Status: DC

## 2023-07-31 MED ORDER — SIMVASTATIN 40 MG PO TABS: 40.0000 mg | ORAL_TABLET | Freq: Every day | ORAL | 1 refills | Status: DC

## 2023-07-31 MED ORDER — IPRATROPIUM-ALBUTEROL 0.5-2.5 (3) MG/3ML IN SOLN: 3.0000 mL | Freq: Four times a day (QID) | RESPIRATORY_TRACT | 2 refills | Status: AC | PRN

## 2023-08-04 ENCOUNTER — Ambulatory Visit: Payer: Self-pay | Admitting: Internal Medicine

## 2023-08-06 ENCOUNTER — Other Ambulatory Visit: Payer: Self-pay | Admitting: Internal Medicine

## 2023-08-13 DIAGNOSIS — H2513 Age-related nuclear cataract, bilateral: Secondary | ICD-10-CM | POA: Diagnosis not present

## 2023-08-13 DIAGNOSIS — E113293 Type 2 diabetes mellitus with mild nonproliferative diabetic retinopathy without macular edema, bilateral: Secondary | ICD-10-CM | POA: Diagnosis not present

## 2023-08-27 ENCOUNTER — Other Ambulatory Visit: Payer: Self-pay | Admitting: Internal Medicine

## 2023-09-15 ENCOUNTER — Other Ambulatory Visit: Payer: Self-pay | Admitting: Internal Medicine

## 2023-09-17 ENCOUNTER — Other Ambulatory Visit: Payer: Self-pay

## 2023-09-17 ENCOUNTER — Encounter: Payer: Self-pay | Admitting: Internal Medicine

## 2023-09-17 DIAGNOSIS — I10 Essential (primary) hypertension: Secondary | ICD-10-CM

## 2023-09-17 MED ORDER — SIMVASTATIN 40 MG PO TABS: 40.0000 mg | ORAL_TABLET | Freq: Every day | ORAL | 0 refills | Status: DC

## 2023-09-17 MED ORDER — MONTELUKAST SODIUM 10 MG PO TABS: 10.0000 mg | ORAL_TABLET | Freq: Every day | ORAL | 0 refills | Status: DC

## 2023-09-17 MED ORDER — PANTOPRAZOLE SODIUM 40 MG PO TBEC: 40.0000 mg | DELAYED_RELEASE_TABLET | Freq: Every day | ORAL | 0 refills | Status: DC

## 2023-09-17 MED ORDER — LORATADINE 10 MG PO TABS: 10.0000 mg | ORAL_TABLET | Freq: Every day | ORAL | 0 refills | Status: DC

## 2023-09-17 MED ORDER — METFORMIN HCL 1000 MG PO TABS: ORAL_TABLET | ORAL | 3 refills | Status: DC

## 2023-09-17 MED ORDER — METFORMIN HCL 1000 MG PO TABS
ORAL_TABLET | ORAL | 3 refills | Status: DC
Start: 2023-09-17 — End: 2023-09-17

## 2023-09-17 MED ORDER — LANTUS SOLOSTAR 100 UNIT/ML ~~LOC~~ SOPN: 40.0000 [IU] | PEN_INJECTOR | Freq: Every day | SUBCUTANEOUS | 10 refills | Status: DC

## 2023-09-17 MED ORDER — LISINOPRIL-HYDROCHLOROTHIAZIDE 20-12.5 MG PO TABS: 1.0000 | ORAL_TABLET | Freq: Every day | ORAL | 0 refills | Status: DC

## 2023-09-17 MED ORDER — ALBUTEROL SULFATE HFA 108 (90 BASE) MCG/ACT IN AERS: 1.0000 | INHALATION_SPRAY | Freq: Four times a day (QID) | RESPIRATORY_TRACT | 1 refills | Status: DC | PRN

## 2023-09-23 ENCOUNTER — Encounter: Payer: Self-pay | Admitting: Internal Medicine

## 2023-09-23 DIAGNOSIS — Z111 Encounter for screening for respiratory tuberculosis: Secondary | ICD-10-CM

## 2023-09-23 NOTE — Telephone Encounter (Signed)
**Note De-identified  [place] Obfuscation** Please advise 

## 2023-09-23 NOTE — Telephone Encounter (Signed)
 I have ordered the blood test for TB she can get anytime. If we have heplisav in stock she can get at our office. If not then she can get at any pharmacy or health department.

## 2023-09-23 NOTE — Telephone Encounter (Signed)
 Patient is needing the series done and she states that either or skin or blood test can do.

## 2023-09-24 ENCOUNTER — Other Ambulatory Visit (INDEPENDENT_AMBULATORY_CARE_PROVIDER_SITE_OTHER)

## 2023-09-24 DIAGNOSIS — Z111 Encounter for screening for respiratory tuberculosis: Secondary | ICD-10-CM | POA: Diagnosis not present

## 2023-09-25 ENCOUNTER — Ambulatory Visit: Payer: PRIVATE HEALTH INSURANCE

## 2023-09-25 NOTE — Telephone Encounter (Unsigned)
 Copied from CRM [id number]. Topic: Appointments - Scheduling Inquiry for Clinic >> Sep 24, 2023  4:11 PM Destiny Trujillo wrote: Reason for CRM: Patient was wondering if her provider could order a Pneumococcal (Pneumovax) vaccination for her. If possible, she would like to proceed with scheduling.

## 2023-09-25 NOTE — Telephone Encounter (Signed)
 Patient is on the nurse schedule today and I will inform her that we can do this as well

## 2023-09-26 LAB — QUANTIFERON-TB GOLD PLUS
Mitogen-NIL: 7.13 [IU]/mL
NIL: 0.01 [IU]/mL
QuantiFERON-TB Gold Plus: NEGATIVE
TB1-NIL: 0 [IU]/mL
TB2-NIL: 0 [IU]/mL

## 2023-09-28 ENCOUNTER — Other Ambulatory Visit: Payer: Self-pay | Admitting: Internal Medicine

## 2023-09-29 ENCOUNTER — Ambulatory Visit: Payer: Self-pay | Admitting: Internal Medicine

## 2023-10-02 ENCOUNTER — Other Ambulatory Visit: Payer: Self-pay | Admitting: Internal Medicine

## 2023-10-06 ENCOUNTER — Telehealth: Payer: Self-pay | Admitting: Radiology

## 2023-10-06 NOTE — Telephone Encounter (Signed)
 Copied from CRM [id number]. Topic: Clinical - Medication Question >> Oct 06, 2023  9:39 AM Tinnie BROCKS wrote: Reason for CRM: Trae from [facility] calling to see if we received a fax about changing albuterol  to generic. Ey#[id number]

## 2023-10-08 ENCOUNTER — Other Ambulatory Visit: Payer: Self-pay

## 2023-10-08 MED ORDER — ALBUTEROL SULFATE (2.5 MG/3ML) 0.083% IN NEBU: 2.5000 mg | INHALATION_SOLUTION | Freq: Four times a day (QID) | RESPIRATORY_TRACT | 1 refills | Status: AC | PRN

## 2023-10-08 NOTE — Telephone Encounter (Signed)
 This has been received and sent in for patient

## 2023-10-08 NOTE — Telephone Encounter (Signed)
 I have sent this in for patient

## 2023-10-17 ENCOUNTER — Ambulatory Visit: Payer: PRIVATE HEALTH INSURANCE | Admitting: Orthopedic Surgery

## 2023-10-20 ENCOUNTER — Ambulatory Visit: Payer: PRIVATE HEALTH INSURANCE | Admitting: Internal Medicine

## 2023-10-21 ENCOUNTER — Ambulatory Visit: Payer: PRIVATE HEALTH INSURANCE | Admitting: Orthopedic Surgery

## 2023-11-24 ENCOUNTER — Encounter: Payer: Self-pay | Admitting: Radiology

## 2023-11-26 ENCOUNTER — Ambulatory Visit: Payer: PRIVATE HEALTH INSURANCE

## 2023-12-10 ENCOUNTER — Encounter: Payer: PRIVATE HEALTH INSURANCE | Admitting: Internal Medicine

## 2023-12-11 ENCOUNTER — Other Ambulatory Visit: Payer: Self-pay | Admitting: Internal Medicine

## 2023-12-11 ENCOUNTER — Telehealth: Payer: Self-pay

## 2023-12-11 DIAGNOSIS — I10 Essential (primary) hypertension: Secondary | ICD-10-CM

## 2023-12-11 NOTE — Telephone Encounter (Signed)
 Copied from CRM #[id number]. Topic: Clinical - Medication Question >> Dec 11, 2023  8:59 AM Destiny Trujillo wrote: Reason for CRM: patient is requesting a 90 day supply with three additional refills. [facility].   lisinopril -hydrochlorothiazide  (ZESTORETIC ) 20-12.5 MG tablet [[id number]] montelukast  (SINGULAIR ) 10 MG tablet [[id number]] simvastatin  (ZOCOR ) 40 MG tablet [[id number]]  [address] [place], [address]  Fax:[phone] Phone:[phone]

## 2023-12-12 ENCOUNTER — Other Ambulatory Visit: Payer: Self-pay

## 2023-12-12 DIAGNOSIS — E1169 Type 2 diabetes mellitus with other specified complication: Secondary | ICD-10-CM

## 2023-12-12 DIAGNOSIS — I10 Essential (primary) hypertension: Secondary | ICD-10-CM

## 2023-12-12 DIAGNOSIS — J4531 Mild persistent asthma with (acute) exacerbation: Secondary | ICD-10-CM

## 2023-12-12 MED ORDER — SIMVASTATIN 40 MG PO TABS: 40.0000 mg | ORAL_TABLET | Freq: Every day | ORAL | 3 refills | Status: DC

## 2023-12-12 MED ORDER — MONTELUKAST SODIUM 10 MG PO TABS: 10.0000 mg | ORAL_TABLET | Freq: Every day | ORAL | 3 refills | Status: AC

## 2023-12-12 MED ORDER — LISINOPRIL-HYDROCHLOROTHIAZIDE 20-12.5 MG PO TABS: 1.0000 | ORAL_TABLET | Freq: Every day | ORAL | 3 refills | Status: AC

## 2023-12-12 NOTE — Telephone Encounter (Signed)
 Refills sent in. LVM for pt.

## 2023-12-12 NOTE — Telephone Encounter (Signed)
 Ok for refills

## 2023-12-23 ENCOUNTER — Encounter: Payer: Self-pay | Admitting: Internal Medicine

## 2023-12-23 ENCOUNTER — Ambulatory Visit: Payer: PRIVATE HEALTH INSURANCE | Admitting: Internal Medicine

## 2023-12-23 VITALS — BP 126/70 | HR 83 | Temp 98.8°F | Ht 59.0 in | Wt 166.8 lb

## 2023-12-23 DIAGNOSIS — E785 Hyperlipidemia, unspecified: Secondary | ICD-10-CM

## 2023-12-23 DIAGNOSIS — Z0000 Encounter for general adult medical examination without abnormal findings: Secondary | ICD-10-CM

## 2023-12-23 DIAGNOSIS — I10 Essential (primary) hypertension: Secondary | ICD-10-CM

## 2023-12-23 DIAGNOSIS — R1013 Epigastric pain: Secondary | ICD-10-CM

## 2023-12-23 DIAGNOSIS — E118 Type 2 diabetes mellitus with unspecified complications: Secondary | ICD-10-CM

## 2023-12-23 DIAGNOSIS — E1169 Type 2 diabetes mellitus with other specified complication: Secondary | ICD-10-CM

## 2023-12-23 LAB — CBC
HCT: 39.5 % (ref 36.0–46.0)
Hemoglobin: 12.9 g/dL (ref 12.0–15.0)
MCHC: 32.7 g/dL (ref 30.0–36.0)
MCV: 79.9 fl (ref 78.0–100.0)
Platelets: 329 K/uL (ref 150.0–400.0)
RBC: 4.94 Mil/uL (ref 3.87–5.11)
RDW: 14.9 % (ref 11.5–15.5)
WBC: 8.7 K/uL (ref 4.0–10.5)

## 2023-12-23 LAB — COMPREHENSIVE METABOLIC PANEL WITH GFR
ALT: 17 U/L (ref 0–35)
AST: 16 U/L (ref 0–37)
Albumin: 4.7 g/dL (ref 3.5–5.2)
Alkaline Phosphatase: 78 U/L (ref 39–117)
BUN: 15 mg/dL (ref 6–23)
CO2: 31 meq/L (ref 19–32)
Calcium: 10.5 mg/dL (ref 8.4–10.5)
Chloride: 102 meq/L (ref 96–112)
Creatinine, Ser: 0.84 mg/dL (ref 0.40–1.20)
GFR: 76.1 mL/min (ref >60.00)
Glucose, Bld: 91 mg/dL (ref 70–99)
Potassium: 3.9 meq/L (ref 3.5–5.1)
Sodium: 140 meq/L (ref 135–145)
Total Bilirubin: 0.4 mg/dL (ref 0.2–1.2)
Total Protein: 8.1 g/dL (ref 6.0–8.3)

## 2023-12-23 LAB — LIPID PANEL
Cholesterol: 154 mg/dL (ref 0–200)
HDL: 59.3 mg/dL (ref >39.00)
LDL Cholesterol: 80 mg/dL (ref 0–99)
NonHDL: 94.54
Total CHOL/HDL Ratio: 3
Triglycerides: 72 mg/dL (ref 0.0–149.0)
VLDL: 14.4 mg/dL (ref 0.0–40.0)

## 2023-12-23 LAB — HEMOGLOBIN A1C: Hgb A1c MFr Bld: 7.1 % — ABNORMAL HIGH (ref 4.6–6.5)

## 2023-12-23 NOTE — Progress Notes (Signed)
 Subjective:   Patient ID: Destiny Trujillo, female    DOB: 12-27-1965, 58 y.o.   MRN: [id number]  The patient is here for physical. Pertinent topics discussed: Discussed the use of AI scribe software for clinical note transcription with the patient, who gave verbal consent to proceed.  History of Present Illness Destiny Trujillo is a 58 year old female with asthma and diabetes who presents with intermittent chest pain.  She experiences intermittent aching pain in the upper chest, described as 'not sharp,' occurring randomly about once a week or every other week. The pain lasts for a couple of minutes and resolves after she goes to the bathroom. It is not triggered by lifting or physical activity but can be triggered by stretching at night.  Her asthma has been stable with no major flare-ups. She uses albuterol  once a day and takes Advair and Flonase  daily. Cold weather has not exacerbated her asthma symptoms recently.  Her diabetes management includes 40 units of insulin  once a day and metformin , reduced to one pill daily due to previous episodes of low blood sugar. Her blood sugars have been slightly high due to the holidays but generally stable, with no episodes of hypoglycemia.  She has made dietary changes, avoiding greasy and spicy foods, which has allowed her to stop taking pantoprazole . She has lost about 12 pounds since July, which she attributes to these dietary changes.  She experiences occasional joint pain, for which she takes Tylenol, and acknowledges the need for more exercise. She also reports difficulty trimming her toenails due to thickness and darkness, which she attributes to her diabetes.  No extra gas, diarrhea, constipation, heartburn, or acid reflux. Regular bowel movements since starting prunes. Occasional burping but no unusual breathing issues. Her feet do hurt a little bit here and there.  EKG: Rate 80, axis normal, interval normal, sinus, no st or t wave  changes, no significant change compared to prior 2020   [facility] 2019 PMH, [facility], social history reviewed and updated  Review of Systems  Constitutional: Negative.   HENT: Negative.    Eyes: Negative.   Respiratory:  Negative for cough, chest tightness and shortness of breath.   Cardiovascular:  Positive for chest pain. Negative for palpitations and leg swelling.  Gastrointestinal:  Positive for abdominal distention. Negative for abdominal pain, constipation, diarrhea, nausea and vomiting.  Musculoskeletal:  Positive for arthralgias.  Skin: Negative.   Neurological: Negative.   Psychiatric/Behavioral: Negative.      Objective:  Physical Exam Constitutional:      Appearance: She is well-developed.  HENT:     Head: Normocephalic and atraumatic.  Cardiovascular:     Rate and Rhythm: Normal rate and regular rhythm.  Pulmonary:     Effort: Pulmonary effort is normal. No respiratory distress.     Breath sounds: Normal breath sounds. No wheezing or rales.  Abdominal:     General: Bowel sounds are normal. There is no distension.     Palpations: Abdomen is soft.     Tenderness: There is no abdominal tenderness.  Musculoskeletal:     Cervical back: Normal range of motion.  Skin:    General: Skin is warm and dry.  Neurological:     Mental Status: She is alert and oriented to person, place, and time.     Coordination: Coordination normal.     Vitals:   12/23/23 0937  BP: 126/70  Pulse: 83  Temp: 98.8 F (37.1 C)  TempSrc: Oral  SpO2:  97%  Weight: 166 lb 12.8 oz (75.7 kg)  Height: 4' 11 (1.499 m)    Assessment & Plan:

## 2023-12-23 NOTE — Patient Instructions (Addendum)
 We will check the labs today. Think about the pneumonia and shingles vaccine.

## 2023-12-24 ENCOUNTER — Ambulatory Visit: Payer: Self-pay | Admitting: Internal Medicine

## 2023-12-25 ENCOUNTER — Other Ambulatory Visit: Payer: Self-pay | Admitting: Internal Medicine

## 2023-12-25 MED ORDER — FLUTICASONE-SALMETEROL 250-50 MCG/ACT IN AEPB: 1.0000 | INHALATION_SPRAY | Freq: Two times a day (BID) | RESPIRATORY_TRACT | 11 refills | Status: AC

## 2023-12-25 MED ORDER — LANTUS SOLOSTAR 100 UNIT/ML ~~LOC~~ SOPN: 40.0000 [IU] | PEN_INJECTOR | Freq: Every day | SUBCUTANEOUS | 10 refills | Status: AC

## 2023-12-25 NOTE — Telephone Encounter (Signed)
 Copied from CRM #[id number]. Topic: Clinical - Medication Refill >> Dec 25, 2023  9:24 AM Robinson H wrote: Medication: insulin  glargine (LANTUS  SOLOSTAR) 100 UNIT/ML Solostar Pen, fluticasone -salmeterol (ADVAIR) 250-50 MCG/ACT AEPB  Has the patient contacted their pharmacy? Yes, Pharmacy calling [place] (Agent: If no, request that the patient contact the pharmacy for the refill. If patient does not wish to contact the pharmacy document the reason why and proceed with request.) (Agent: If yes, when and what did the pharmacy advise?)  This is the patient's preferred pharmacy:  [facility] - [place], [place] - [address] [place] [address] Phone: [phone] Fax: [phone]  Is this the correct pharmacy for this prescription? Yes If no, delete pharmacy and type the correct one.   Has the prescription been filled recently? No  Is the patient out of the medication? Yes  Has the patient been seen for an appointment in the last year OR does the patient have an upcoming appointment? Yes  Can we respond through MyChart? N/A  Agent: Please be advised that Rx refills may take up to 3 business days. We ask that you follow-up with your pharmacy.

## 2023-12-26 NOTE — Assessment & Plan Note (Signed)
 Checking HgA1c, CMP, lipid panel and adjust as needed.

## 2023-12-26 NOTE — Assessment & Plan Note (Signed)
 BP at goal on regimen checking CMP and adjust as needed.

## 2023-12-26 NOTE — Assessment & Plan Note (Signed)
 Flu shot up to date. Pneumonia counseled. Shingrix due. Tetanus up to date. Colonoscopy up to date with [facility]. Mammogram up to date with gyn, pap smear up to date with gyn. Counseled about sun safety and mole surveillance. Counseled about the dangers of distracted driving. Given 10 year screening recommendations.

## 2023-12-26 NOTE — Assessment & Plan Note (Signed)
 EKG to assess new epigastric pain which was without changes. Will have her monitor and dietary changes have helped some.

## 2023-12-26 NOTE — Assessment & Plan Note (Signed)
 Checking lipid panel and adjust as needed.

## 2023-12-30 ENCOUNTER — Ambulatory Visit: Payer: Self-pay | Admitting: Podiatry

## 2024-01-06 ENCOUNTER — Encounter: Payer: Self-pay | Admitting: Internal Medicine

## 2024-01-09 ENCOUNTER — Ambulatory Visit: Payer: PRIVATE HEALTH INSURANCE | Admitting: Internal Medicine

## 2024-01-14 ENCOUNTER — Other Ambulatory Visit: Payer: Self-pay

## 2024-01-16 ENCOUNTER — Telehealth: Admitting: Family Medicine

## 2024-01-16 DIAGNOSIS — J4531 Mild persistent asthma with (acute) exacerbation: Secondary | ICD-10-CM

## 2024-01-16 DIAGNOSIS — J40 Bronchitis, not specified as acute or chronic: Secondary | ICD-10-CM | POA: Diagnosis not present

## 2024-01-16 MED ORDER — AZITHROMYCIN 250 MG PO TABS: ORAL_TABLET | ORAL | 0 refills | Status: AC

## 2024-01-16 MED ORDER — BENZONATATE 200 MG PO CAPS: 200.0000 mg | ORAL_CAPSULE | Freq: Two times a day (BID) | ORAL | 0 refills | Status: AC | PRN

## 2024-01-16 MED ORDER — PREDNISONE 20 MG PO TABS: 20.0000 mg | ORAL_TABLET | Freq: Two times a day (BID) | ORAL | 0 refills | Status: AC

## 2024-01-16 NOTE — Progress Notes (Signed)
 " Virtual Visit Consent   Destiny Trujillo, you are scheduled for a virtual visit with a [facility] provider today. Just as with appointments in the office, your consent must be obtained to participate. Your consent will be active for this visit and any virtual visit you may have with one of our providers in the next 365 days. If you have a MyChart account, a copy of this consent can be sent to you electronically.  As this is a virtual visit, video technology does not allow for your provider to perform a traditional examination. This may limit your provider's ability to fully assess your condition. If your provider identifies any concerns that need to be evaluated in person or the need to arrange testing (such as labs, EKG, etc.), we will make arrangements to do so. Although advances in technology are sophisticated, we cannot ensure that it will always work on either your end or our end. If the connection with a video visit is poor, the visit may have to be switched to a telephone visit. With either a video or telephone visit, we are not always able to ensure that we have a secure connection.  By engaging in this virtual visit, you consent to the provision of healthcare and authorize for your insurance to be billed (if applicable) for the services provided during this visit. Depending on your insurance coverage, you may receive a charge related to this service.  I need to obtain your verbal consent now. Are you willing to proceed with your visit today? Destiny Trujillo has provided verbal consent on 01/16/2024 for a virtual visit (video or telephone). Loa Lamp, FNP  Date: 01/16/2024 9:51 AM   Virtual Visit via Video Note   I, Loa Lamp, connected with  Destiny Trujillo  ([id number], [birth year]) on 01/16/2024 at  9:45 AM EST by a video-enabled telemedicine application and verified that I am speaking with the correct person using two identifiers.  Location: Patient: Virtual Visit  Location Patient: Home Provider: Virtual Visit Location Provider: Home Office   I discussed the limitations of evaluation and management by telemedicine and the availability of in person appointments. The patient expressed understanding and agreed to proceed.    History of Present Illness: Destiny Trujillo is a 58 y.o. who identifies as a female who was assigned female at birth, and is being seen today for sore throat, cough, runny nose, no fever, cough with clear mucus, wheezing, uses nebulizer, sinus pressure or pain Sx for 4-5 days. History of asthma .  HPI: HPI  Problems:  Patient Active Problem List   Diagnosis Date Noted   Epigastric pain 07/30/2023   Routine general medical examination at a health care facility 12/10/2018   Insomnia 05/11/2018   Type 2 diabetes with complication (HCC) 03/18/2015   Essential hypertension 03/18/2015   Hyperlipidemia associated with type 2 diabetes mellitus (HCC) 03/18/2015   Asthma 03/18/2015    Allergies: Allergies[1] Medications: Current Medications[2]  Observations/Objective: Patient is well-developed, well-nourished in no acute distress.  Resting comfortably  at home.  Head is normocephalic, atraumatic.  No labored breathing.  Speech is clear and coherent with logical content.  Patient is alert and oriented at baseline.    Assessment and Plan: 1. Mild persistent asthma with acute exacerbation (Primary)  Increase fluids, humidifier at night, UC as needed.   Follow Up Instructions: I discussed the assessment and treatment plan with the patient. The patient was provided an opportunity to ask questions and all were answered.  The patient agreed with the plan and demonstrated an understanding of the instructions.  A copy of instructions were sent to the patient via MyChart unless otherwise noted below.     The patient was advised to call back or seek an in-person evaluation if the symptoms worsen or if the condition fails to improve  as anticipated.    Hamdi Vari, FNP     [1]  Allergies Allergen Reactions   Penicillins    Penicillins Nausea And Vomiting and Rash    Has patient had a PCN reaction causing immediate rash, facial/tongue/throat swelling, SOB or lightheadedness with hypotension: No Has patient had a PCN reaction causing severe rash involving mucus membranes or skin necrosis: Yes Has patient had a PCN reaction that required hospitalization No Has patient had a PCN reaction occurring within the last 10 years: Yes If all of the above answers are NO, then may proceed with Cephalosporin use.  [2]  Current Outpatient Medications:    azithromycin  (ZITHROMAX ) 250 MG tablet, Take 2 tablets on day 1, then 1 tablet daily on days 2 through 5, Disp: 6 tablet, Rfl: 0   benzonatate  (TESSALON ) 200 MG capsule, Take 1 capsule (200 mg total) by mouth 2 (two) times daily as needed for cough., Disp: 20 capsule, Rfl: 0   predniSONE  (DELTASONE ) 20 MG tablet, Take 1 tablet (20 mg total) by mouth 2 (two) times daily with a meal for 5 days. Stop if glucose goes over 250, Disp: 10 tablet, Rfl: 0   albuterol  (PROVENTIL ) (2.5 MG/3ML) 0.083% nebulizer solution, Take 3 mLs (2.5 mg total) by nebulization every 6 (six) hours as needed for wheezing or shortness of breath., Disp: 150 mL, Rfl: 1   albuterol  (VENTOLIN  HFA) 108 (90 Base) MCG/ACT inhaler, INHALE 1 PUFF BY MOUTH EVERY 6 HOURS AS NEEDED FOR WHEEZING AND FOR SHORTNESS OF BREATH, Disp: 18 g, Rfl: 0   aspirin 81 MG tablet, Take 81 mg by mouth daily., Disp: , Rfl:    BD PEN NEEDLE NANO U/F 32G X 4 MM MISC, Use 1 each to inject insulin  into skin daily., Disp: 100 each, Rfl: 3   Continuous Glucose Receiver (DEXCOM G7 RECEIVER) DEVI, Use to measure sugars, Disp: 1 each, Rfl: 0   Continuous Glucose Sensor (DEXCOM G7 SENSOR) MISC, Change every 10 days to monitor sugars., Disp: 3 each, Rfl: 6   Continuous Glucose Transmitter (DEXCOM G6 TRANSMITTER) MISC, Apply 1 each topically every 90  days., Disp: 1 each, Rfl: 11   cyclobenzaprine  (FLEXERIL ) 5 MG tablet, Take 1 tablet (5 mg total) by mouth 3 (three) times daily as needed for muscle spasms. (Patient not taking: Reported on 12/23/2023), Disp: 30 tablet, Rfl: 1   fluticasone  (FLONASE ) 50 MCG/ACT nasal spray, Place 2 sprays into both nostrils daily., Disp: 48 g, Rfl: 6   fluticasone -salmeterol (ADVAIR) 250-50 MCG/ACT AEPB, Inhale 1 puff into the lungs in the morning and at bedtime., Disp: 60 each, Rfl: 11   insulin  glargine (LANTUS  SOLOSTAR) 100 UNIT/ML Solostar Pen, Inject 40 Units into the skin daily., Disp: 15 mL, Rfl: 10   ipratropium-albuterol  (DUONEB) 0.5-2.5 (3) MG/3ML SOLN, Take 3 mLs by nebulization every 6 (six) hours as needed., Disp: 360 mL, Rfl: 2   lisinopril -hydrochlorothiazide  (ZESTORETIC ) 20-12.5 MG tablet, Take 1 tablet by mouth daily., Disp: 90 tablet, Rfl: 3   loratadine  (CLARITIN ) 10 MG tablet, Take 1 tablet (10 mg total) by mouth daily., Disp: 90 tablet, Rfl: 0   metFORMIN  (GLUCOPHAGE ) 1000 MG tablet, TAKE 1 TABLET  BY MOUTH TWICE DAILY WITH MEALS, Disp: 180 tablet, Rfl: 0   montelukast  (SINGULAIR ) 10 MG tablet, Take 1 tablet (10 mg total) by mouth at bedtime., Disp: 90 tablet, Rfl: 3   Multiple Vitamin (MULTIVITAMIN) capsule, Take 1 capsule by mouth daily., Disp: , Rfl:    simvastatin  (ZOCOR ) 40 MG tablet, Take 1 tablet (40 mg total) by mouth daily., Disp: 90 tablet, Rfl: 3   traZODone  (DESYREL ) 50 MG tablet, Take 1 tablet (50 mg total) by mouth at bedtime. (Patient not taking: Reported on 12/23/2023), Disp: 90 tablet, Rfl: 3  "

## 2024-01-16 NOTE — Patient Instructions (Signed)

## 2024-01-19 ENCOUNTER — Other Ambulatory Visit: Payer: Self-pay

## 2024-01-19 DIAGNOSIS — E1169 Type 2 diabetes mellitus with other specified complication: Secondary | ICD-10-CM

## 2024-01-19 DIAGNOSIS — I10 Essential (primary) hypertension: Secondary | ICD-10-CM

## 2024-01-19 MED ORDER — SIMVASTATIN 40 MG PO TABS: 40.0000 mg | ORAL_TABLET | Freq: Every day | ORAL | 3 refills | Status: AC

## 2024-01-19 MED ORDER — LORATADINE 10 MG PO TABS: 10.0000 mg | ORAL_TABLET | Freq: Every day | ORAL | 0 refills | Status: AC

## 2024-01-19 MED ORDER — FLUTICASONE PROPIONATE 50 MCG/ACT NA SUSP: 2.0000 | Freq: Every day | NASAL | 6 refills | Status: AC

## 2024-01-19 MED ORDER — METFORMIN HCL 1000 MG PO TABS: 1000.0000 mg | ORAL_TABLET | Freq: Two times a day (BID) | ORAL | 0 refills | Status: AC

## 2024-01-19 NOTE — Telephone Encounter (Signed)
 Meds sent to [facility] 90 day supply.

## 2024-04-12 ENCOUNTER — Ambulatory Visit: Payer: PRIVATE HEALTH INSURANCE | Admitting: Internal Medicine
